# Patient Record
Sex: Male | Born: 1995 | Race: White | Hispanic: No | Marital: Single | Smoking: Never smoker
Health system: Southern US, Community
[De-identification: ages and names within clinical notes are randomized; demographics above are authoritative.]

## PROBLEM LIST (undated history)

## (undated) DIAGNOSIS — F840 Autistic disorder: Secondary | ICD-10-CM

## (undated) DIAGNOSIS — R569 Unspecified convulsions: Secondary | ICD-10-CM

## (undated) DIAGNOSIS — E041 Nontoxic single thyroid nodule: Secondary | ICD-10-CM

## (undated) HISTORY — PX: WISDOM TOOTH EXTRACTION: SHX21

---
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
## 2016-02-22 IMAGING — CT CT HEAD W/O CM
2 series · 16 of 30 positions shown, 18 images · non-contrast
Comparison: None.

CLINICAL DATA: Fall at home, initial encounter. Seizure while in
the shower.

EXAM:
CT HEAD WITHOUT CONTRAST
TECHNIQUE: Contiguous axial images were obtained from the base of the skull
through the vertex without intravenous contrast.

[Series 201: head w/o, idose (1) · axial · non-contrast · 0.49mm/px · z∈[+91,+206]mm · 8 of 31 slices shown, 10 images]
[im 4/31  brain]
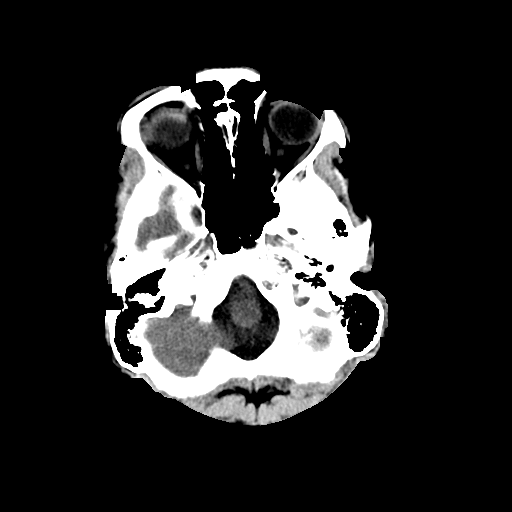
[im 4/31  bone]
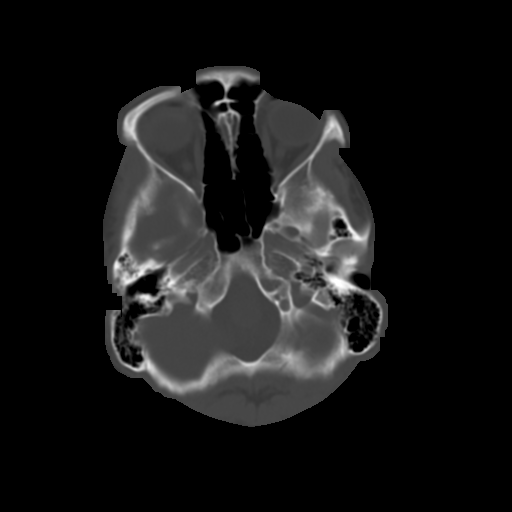
[im 7/31  brain]
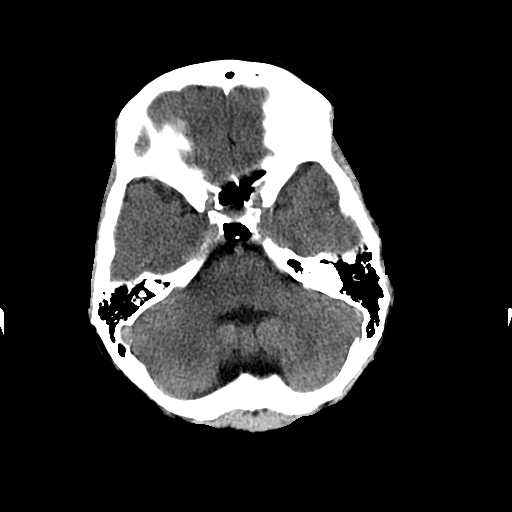
[im 11/31  brain]
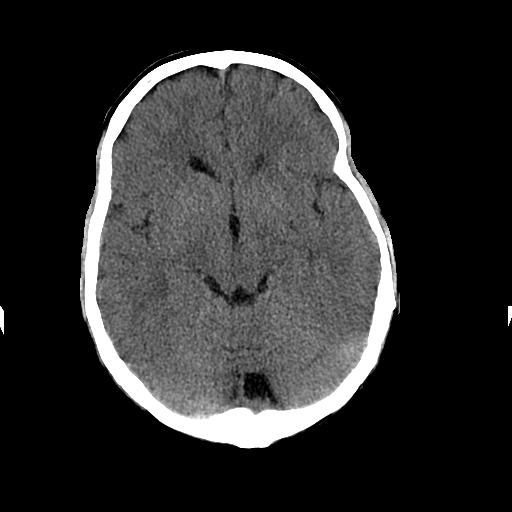
[im 14/31  brain]
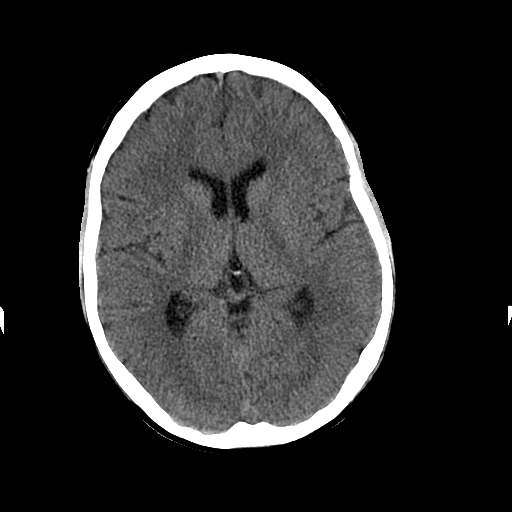
[im 17/31  brain]
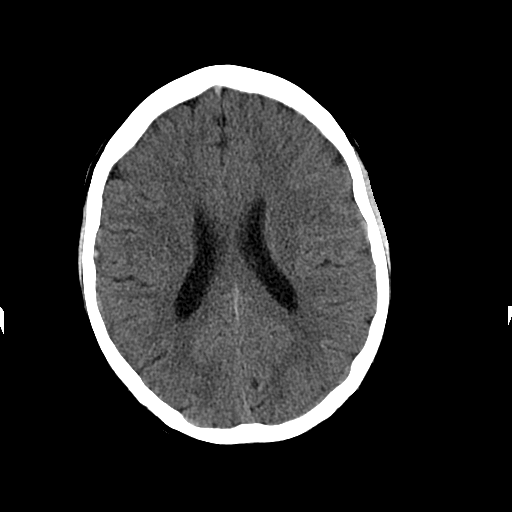
[im 17/31  bone]
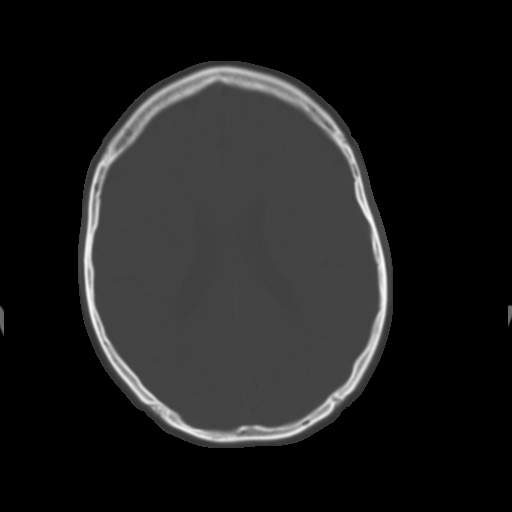
[im 21/31  brain]
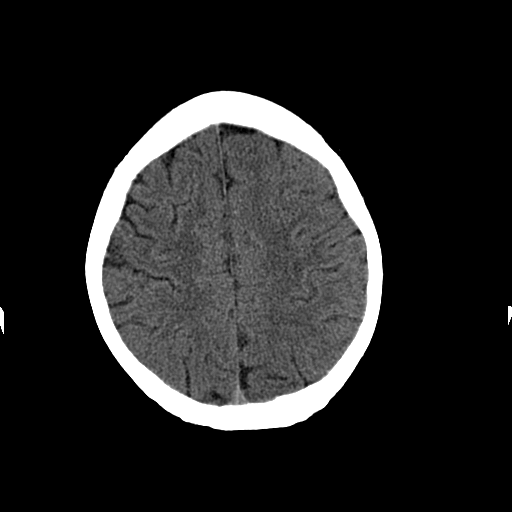
[im 24/31  brain]
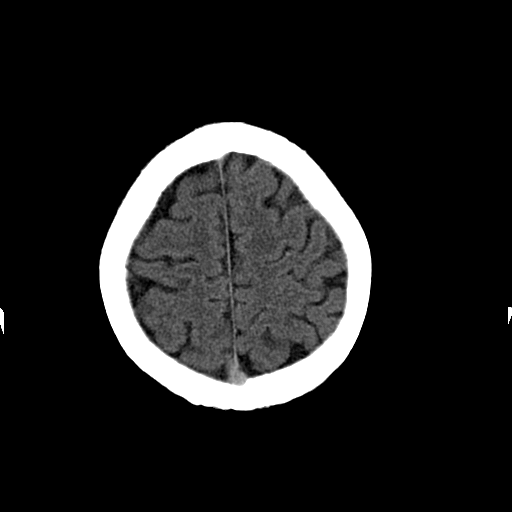
[im 27/31  brain]
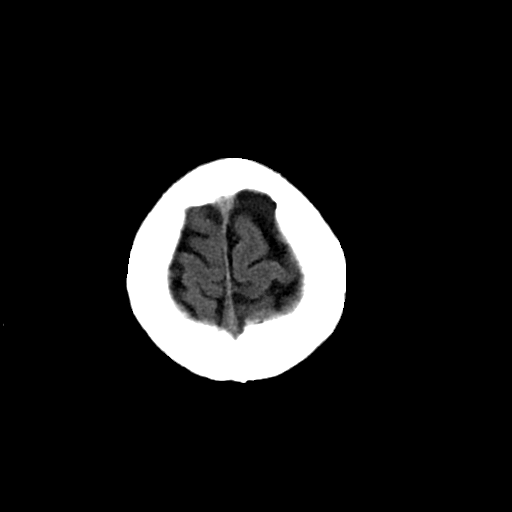

[Series 202: head w/o bone, idose (1) · axial · non-contrast · 0.49mm/px · z∈[+90,+210]mm · 8 of 62 slices shown]
[im 7/62  bone]
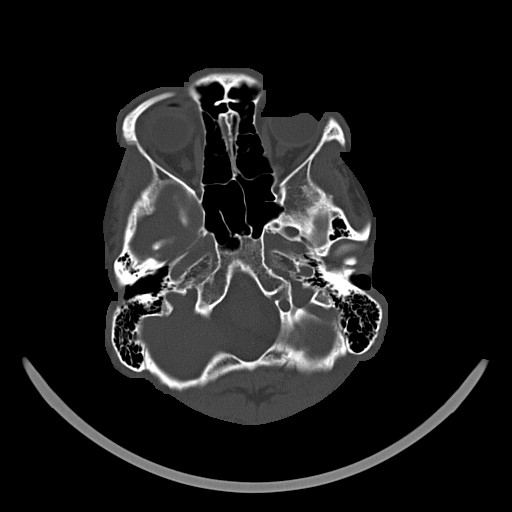
[im 13/62  bone]
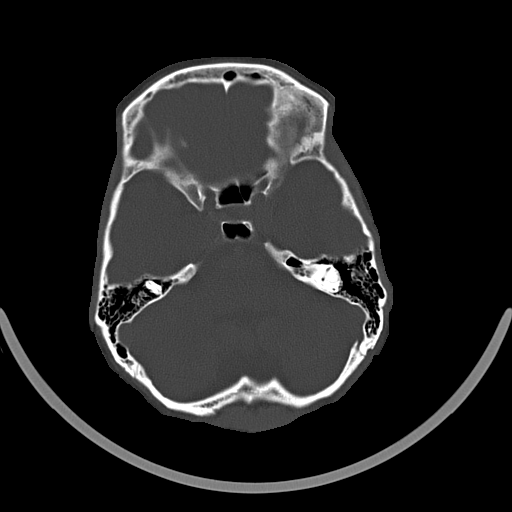
[im 20/62  bone]
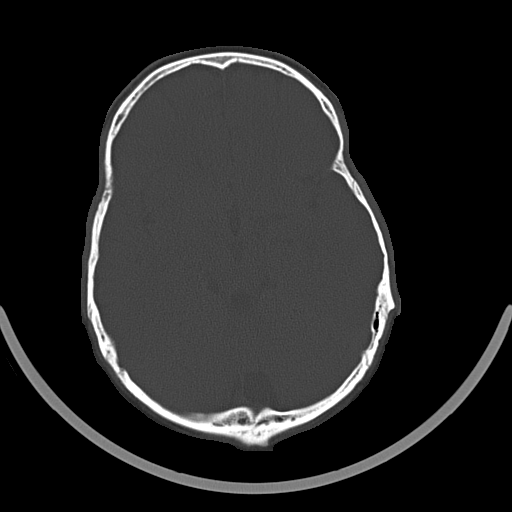
[im 26/62  bone]
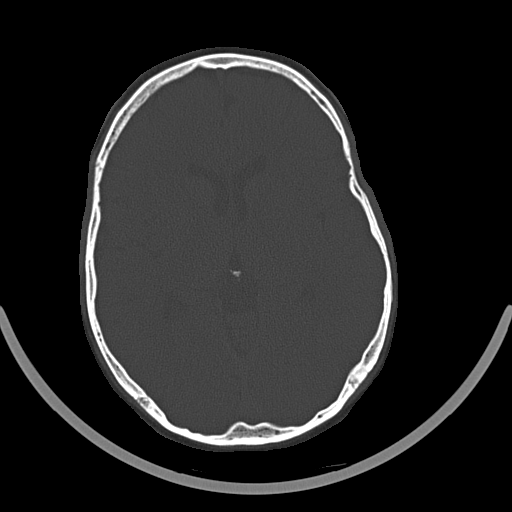
[im 36/62  bone]
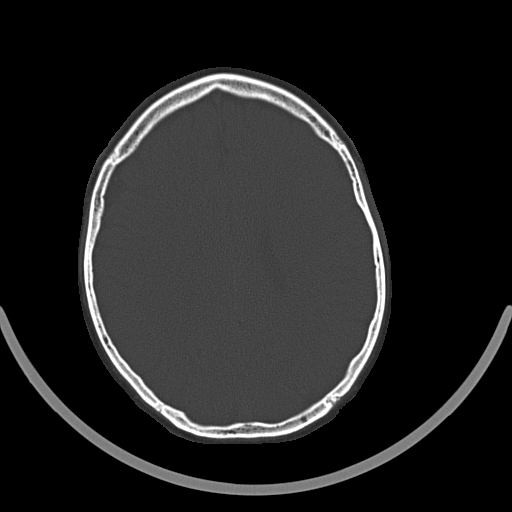
[im 42/62  bone]
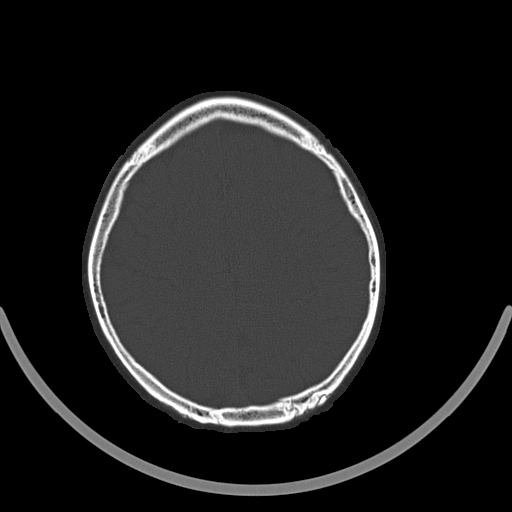
[im 49/62  bone]
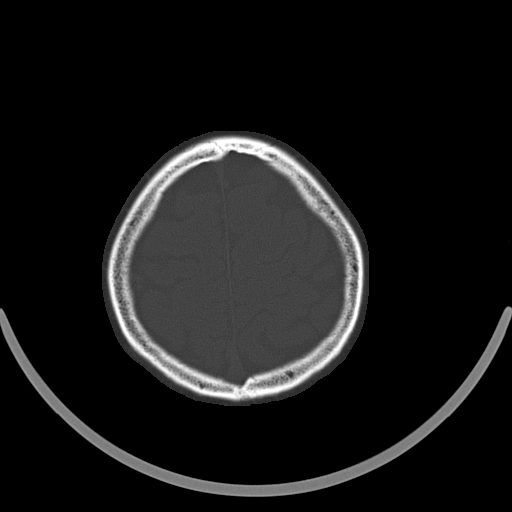
[im 55/62  bone]
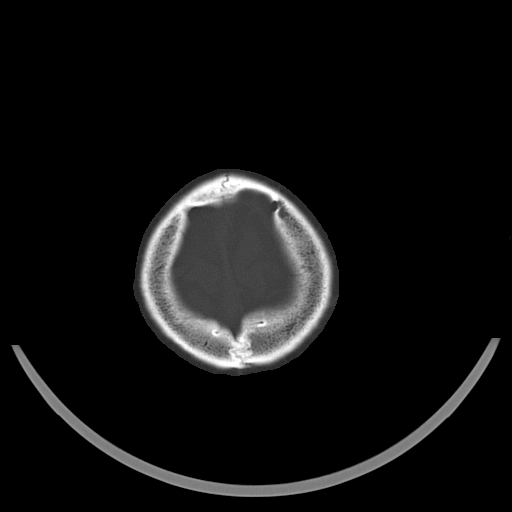

[16 of 30 positions shown; findings below may reference images not displayed]

FINDINGS: No intracranial hemorrhage, mass effect, or midline shift. No
hydrocephalus. The basilar cisterns are patent. Incidental note of
prominent mega cisterna magna. No evidence of territorial infarct.
No intracranial fluid collection. Calvarium is intact. Included
paranasal sinuses and mastoid air cells are well aerated.
IMPRESSION: No acute intracranial abnormality.

## 2016-06-04 MED FILL — MIDAZOLAM HCL 5 MG/ML VIAL: 5 | 2 days supply | Qty: 2 | Fill #0

## 2016-12-06 MED FILL — MIDAZOLAM HCL 5 MG/ML VIAL: 5 | 15 days supply | Qty: 10 | Fill #0

## 2017-03-12 MED FILL — MIDAZOLAM HCL 5 MG/ML VIAL: 5 | 15 days supply | Qty: 10 | Fill #1

## 2017-09-25 MED FILL — MIDAZOLAM HCL 5 MG/ML VIAL: 5 | 5 days supply | Qty: 10 | Fill #0

## 2018-01-17 ENCOUNTER — Telehealth: Payer: Self-pay | Admitting: Family Medicine

## 2018-01-17 NOTE — Telephone Encounter (Signed)
error 

## 2018-05-10 ENCOUNTER — Encounter (HOSPITAL_COMMUNITY): Payer: Self-pay | Admitting: Emergency Medicine

## 2018-05-10 ENCOUNTER — Other Ambulatory Visit: Payer: Self-pay

## 2018-05-10 ENCOUNTER — Emergency Department (HOSPITAL_COMMUNITY)
Admission: EM | Admit: 2018-05-10 | Discharge: 2018-05-10 | Disposition: A | Discharge status: Home / Self Care | Payer: Medicaid Other | Attending: Emergency Medicine | Admitting: Emergency Medicine

## 2018-05-10 ENCOUNTER — Emergency Department (HOSPITAL_COMMUNITY): Payer: Medicaid Other

## 2018-05-10 DIAGNOSIS — S92352A Displaced fracture of fifth metatarsal bone, left foot, initial encounter for closed fracture: Secondary | ICD-10-CM | POA: Insufficient documentation

## 2018-05-10 DIAGNOSIS — S99922A Unspecified injury of left foot, initial encounter: Secondary | ICD-10-CM | POA: Diagnosis present

## 2018-05-10 DIAGNOSIS — Z79899 Other long term (current) drug therapy: Secondary | ICD-10-CM | POA: Diagnosis not present

## 2018-05-10 DIAGNOSIS — X58XXXA Exposure to other specified factors, initial encounter: Secondary | ICD-10-CM | POA: Insufficient documentation

## 2018-05-10 DIAGNOSIS — Y999 Unspecified external cause status: Secondary | ICD-10-CM | POA: Diagnosis not present

## 2018-05-10 DIAGNOSIS — Y929 Unspecified place or not applicable: Secondary | ICD-10-CM | POA: Diagnosis not present

## 2018-05-10 DIAGNOSIS — Y939 Activity, unspecified: Secondary | ICD-10-CM | POA: Insufficient documentation

## 2018-05-10 MED ORDER — IBUPROFEN 400 MG PO TABS
600.0000 mg | ORAL_TABLET | Freq: Once | ORAL | Status: AC
  Administered 2018-05-10: 600 mg via ORAL
  Filled 2018-05-10: qty 2

## 2018-05-10 MED ORDER — IBUPROFEN 600 MG PO TABS: 600.0000 mg | ORAL_TABLET | Freq: Four times a day (QID) | ORAL | 0 refills | Status: AC | PRN

## 2018-05-10 NOTE — ED Triage Notes (Signed)
Patient c/o pain to left foot with swelling and bruising. Patient is autistic and unable to tell mother what happened. Mother reports patient did have low-grade fever of 99.6 this morning. Patient given tylenol for fever and pain at 1pm today. Patient indicates pain with weight bearing.

## 2018-05-10 NOTE — ED Provider Notes (Signed)
[facility] EMERGENCY DEPARTMENT Provider Note   CSN: [id number] Arrival date & time: 05/10/18  1847    History   Chief Complaint Chief Complaint  Patient presents with  . Foot Pain    HPI Steven Ritter is a 23 y.o. male.     Pt presents today with bruising to left foot.  Pt has a hx of autism and is nonverbal.  Mom is unsure what happened.  Mom did give him tylenol around 1300 today.  She has also been putting ice on the foot.  Pt seems to hurt with weight bearing.       Past Medical History:  Diagnosis Date  . Autism   . Seizures (HCC)     There are no active problems to display for this patient.   Past Surgical History:  Procedure Laterality Date  . WISDOM TOOTH EXTRACTION          Home Medications    Prior to Admission medications   Medication Sig Start Date End Date Taking? Authorizing Provider  cetirizine (ZYRTEC) 10 MG tablet Take 10 mg by mouth daily.    [provider]  clonazepam (KLONOPIN) 0.125 MG disintegrating tablet Take 1 tablet (0.125 mg total) by mouth 3 (three) times daily. Patient not taking: Reported on 09/03/2014 03/25/13   Joelyn Oms, MD  clonazePAM (KLONOPIN) 0.25 MG disintegrating tablet Take 0.5 mg by mouth 3 (three) times daily.    [provider]  desmopressin (DDAVP) 0.1 MG tablet Take 0.05 mg by mouth daily.    [provider]  diazepam (DIASTAT ACUDIAL) 10 MG GEL Place 12.5 mg rectally as needed for seizure.    [provider]  fluticasone (FLONASE) 50 MCG/ACT nasal spray Place 2 sprays into both nostrils daily.    [provider]  ibuprofen (ADVIL,MOTRIN) 600 MG tablet Take 1 tablet (600 mg total) by mouth every 6 (six) hours as needed. 05/10/18   Jacalyn Lefevre, MD  lacosamide (VIMPAT) 50 MG TABS tablet 1 by mouth twice a day 7 days, then increase to 2 tablets by mouth twice a day 09/03/14   Rolland Porter, MD  midazolam (VERSED) 5 MG/ML injection Place 2 mLs into the nose as needed  (for seizures). Draw up prescribed dose (ml) in syringe, remove blue vial access device, then attach syringe to nasal atomizer for intranasal administration.    [provider]  montelukast (SINGULAIR) 5 MG chewable tablet Chew 10 mg by mouth at bedtime.    [provider]  OXcarbazepine ER (OXTELLAR XR) 600 MG TB24 Take 1,200 mg by mouth 2 (two) times daily.    [provider]  pyridOXINE (VITAMIN B-6) 100 MG tablet Take 100 mg by mouth 2 (two) times daily.    [provider]    Family History History reviewed. No pertinent family history.  Social History Social History   Tobacco Use  . Smoking status: Never Smoker  . Smokeless tobacco: Never Used  Substance Use Topics  . Alcohol use: No  . Drug use: No     Allergies   Codeine; Xyzal [cetirizine hcl]; Lamotrigine; and Levetiracetam   Review of Systems Review of Systems  Unable to perform ROS: Patient nonverbal  Musculoskeletal:       Left foot bruising     Physical Exam Updated Vital Signs BP 114/66 (BP Location: Right Arm)   Pulse 71   Temp 98.4 F (36.9 C) (Axillary)   Resp 16   Ht 5\' 6"  (1.676 m)  Wt 74.8 kg   SpO2 97%   BMI 26.63 kg/m   Physical Exam Vitals signs and nursing note reviewed.  Constitutional:      Appearance: Normal appearance.  HENT:     Head: Normocephalic and atraumatic.     Right Ear: External ear normal.     Left Ear: External ear normal.     Nose: Nose normal.     Mouth/Throat:     Mouth: Mucous membranes are moist.     Pharynx: Oropharynx is clear.  Eyes:     Extraocular Movements: Extraocular movements intact.     Pupils: Pupils are equal, round, and reactive to light.  Neck:     Musculoskeletal: Normal range of motion and neck supple.  Cardiovascular:     Rate and Rhythm: Normal rate and regular rhythm.     Pulses: Normal pulses.     Heart sounds: Normal heart sounds.  Pulmonary:     Effort: Pulmonary effort is normal.     Breath  sounds: Normal breath sounds.  Abdominal:     General: Abdomen is flat. Bowel sounds are normal.     Palpations: Abdomen is soft.  Musculoskeletal:     Comments: Bruising and tenderness to left foot  Skin:    General: Skin is warm.     Capillary Refill: Capillary refill takes less than 2 seconds.  Neurological:     General: No focal deficit present.     Mental Status: He is alert and oriented to person, place, and time.  Psychiatric:        Mood and Affect: Mood normal.        Behavior: Behavior normal.      ED Treatments / Results  Labs (all labs ordered are listed, but only abnormal results are displayed) Labs Reviewed - No data to display  EKG None  Radiology Dg Foot Complete Left  Result Date: 05/10/2018 CLINICAL DATA:  Pain and swelling of the lateral foot. EXAM: LEFT FOOT - COMPLETE 3+ VIEW COMPARISON:  None. FINDINGS: Comminuted fracture of the mid shaft of the fifth metatarsal. Main distal fragment displaced upward and medial 2-3 mm. No other regional finding. IMPRESSION: Comminuted fracture of the mid shaft of the fifth metatarsal with minimal displacement of the main distal fracture fragment, upward and medial. Electronically Signed   By: Paulina Fusi M.D.   On: 05/10/2018 19:40    Procedures Procedures (including critical care time)  Medications Ordered in ED Medications  ibuprofen (ADVIL,MOTRIN) tablet 600 mg (600 mg Oral Given 05/10/18 1936)     Initial Impression / Assessment and Plan / ED Course  I have reviewed the triage vital signs and the nursing notes.  Pertinent labs & imaging results that were available during my care of the patient were reviewed by me and considered in my medical decision making (see chart for details).    Pt does have a fracture to his left 5th metatarsal.  He is placed in a cam walker and instructed to f/u with ortho.  Return if worse.  Final Clinical Impressions(s) / ED Diagnoses   Final diagnoses:  Closed displaced fracture  of fifth metatarsal bone of left foot, initial encounter    ED Discharge Orders         Ordered    ibuprofen (ADVIL,MOTRIN) 600 MG tablet  Every 6 hours PRN     05/10/18 1950           Jacalyn Lefevre, MD 05/10/18 1951

## 2018-05-12 ENCOUNTER — Telehealth: Payer: Self-pay | Admitting: Orthopedic Surgery

## 2018-05-12 ENCOUNTER — Telehealth (INDEPENDENT_AMBULATORY_CARE_PROVIDER_SITE_OTHER): Payer: Self-pay | Admitting: Radiology

## 2018-05-12 NOTE — Telephone Encounter (Signed)
Patient's mother Olegario Messier called to schedule an appointment for Steven Ritter as he is unable to do this for himself.   Please review notes and advise regarding an appointment for this patient who went to ED on Saturday, 05/10/18 for  Comminuted fracture of the mid shaft of the fifth metatarsal with minimal displacement of the main distal fracture fragment, upward and medial.  Thanks

## 2018-05-12 NOTE — Telephone Encounter (Signed)
Ok 1 week   I am going to keep him in the boot

## 2018-05-12 NOTE — Telephone Encounter (Signed)
I spoke back to Aniello's mother Olegario Messier and gave her the appointment date of May 4th at 11:00 per Dr Mort Sawyers instruction

## 2018-05-12 NOTE — Telephone Encounter (Signed)
This is a 4 week follow up for xrays in person   Wear the boot

## 2018-05-12 NOTE — Telephone Encounter (Signed)
[facility] called and asked about this patient.  Mom called them once told we would see him in 4 weeks.  She is trying to get him seen sooner.  He is autistic per mom and he walks on his toes normally.  She is concerned for him, not sure the boot will immobilize fracture well enough for him.  She says he is walking differently even more so since the injury/getting the boot.  Mom really wants patient to be seen and asks if we can see him now for eval?  Please advise.

## 2018-05-13 DIAGNOSIS — S92352A Displaced fracture of fifth metatarsal bone, left foot, initial encounter for closed fracture: Secondary | ICD-10-CM | POA: Insufficient documentation

## 2018-05-19 ENCOUNTER — Ambulatory Visit (INDEPENDENT_AMBULATORY_CARE_PROVIDER_SITE_OTHER): Payer: Medicaid Other

## 2018-05-19 ENCOUNTER — Ambulatory Visit: Payer: Medicaid Other | Admitting: Orthopedic Surgery

## 2018-05-19 ENCOUNTER — Encounter: Payer: Self-pay | Admitting: Orthopedic Surgery

## 2018-05-19 ENCOUNTER — Other Ambulatory Visit: Payer: Self-pay

## 2018-05-19 DIAGNOSIS — S92352A Displaced fracture of fifth metatarsal bone, left foot, initial encounter for closed fracture: Secondary | ICD-10-CM

## 2018-05-19 NOTE — Progress Notes (Signed)
NEW PATIENT OFFICE VISIT  Chief Complaint  Patient presents with  . Fracture    Left foot  DOI 05/10/18 unsure of how    23 year old male with autism fractured his left hip with no known mechanism of injury  X-ray was done on May 10, 2018 shows a comminuted fracture midshaft fifth metatarsal with soft tissue swelling.  The patient is here with his primary caregiver who gave a history of unknown trauma eventual pain swelling difficulty weightbearing visit to the ER.  He was placed in a cam walker.   Review of Systems  Constitutional: Negative.   Skin: Negative.   Neurological: Positive for tremors and seizures.     Past Medical History:  Diagnosis Date  . Autism   . Seizures (HCC)     Past Surgical History:  Procedure Laterality Date  . WISDOM TOOTH EXTRACTION      No family history on file. Social History   Tobacco Use  . Smoking status: Never Smoker  . Smokeless tobacco: Never Used  Substance Use Topics  . Alcohol use: No  . Drug use: No    Allergies  Allergen Reactions  . Codeine Other (See Comments)    irritability  . Xyzal [Cetirizine Hcl] Other (See Comments)    Tremors  . Lamotrigine     tremors  . Levetiracetam     aggression    Current Meds  Medication Sig  . cetirizine (ZYRTEC) 10 MG tablet Take 10 mg by mouth daily.  . clonazepam (KLONOPIN) 0.125 MG disintegrating tablet Take 1 tablet (0.125 mg total) by mouth 3 (three) times daily.  . clonazePAM (KLONOPIN) 0.25 MG disintegrating tablet Take 0.5 mg by mouth 3 (three) times daily.  . fluticasone (FLONASE) 50 MCG/ACT nasal spray Place 2 sprays into both nostrils daily.  Marland Kitchen. ibuprofen (ADVIL,MOTRIN) 600 MG tablet Take 1 tablet (600 mg total) by mouth every 6 (six) hours as needed.  . lacosamide (VIMPAT) 50 MG TABS tablet 1 by mouth twice a day 7 days, then increase to 2 tablets by mouth twice a day  . midazolam (VERSED) 5 MG/ML injection Place 2 mLs into the nose as needed (for seizures). Draw up  prescribed dose (ml) in syringe, remove blue vial access device, then attach syringe to nasal atomizer for intranasal administration.  . montelukast (SINGULAIR) 5 MG chewable tablet Chew 10 mg by mouth at bedtime.  . OXcarbazepine ER (OXTELLAR XR) 600 MG TB24 Take 1,200 mg by mouth 2 (two) times daily.  Marland Kitchen. pyridOXINE (VITAMIN B-6) 100 MG tablet Take 100 mg by mouth 2 (two) times daily.    BP 110/64   Pulse 63   Ht 5\' 6"  (1.676 m)   Wt 170 lb (77.1 kg)   BMI 27.44 kg/m   Physical Exam General appearance well-developed well-nourished grooming hygiene normal Orientation could not assess secondary to autism which is severe Mood flat affect flat Gait ambulatory with a cam walker  Ortho Exam  Left ankle foot ecchymosis observed in the skin of the soft tissues of the great toe through the lesser digits tenderness at the fifth metatarsal soft tissue swelling there ankle range of motion passively normal ankle stable muscle tone normal no atrophy skin as described color temperature pulse normal normal response to sensory stimuli  MEDICAL DECISION SECTION  Xrays were done at [facility] films [facility] my personal interpretation of the x-ray: Nondisplaced comminuted fracture left fifth metatarsal at the midshaft portion  Follow-up x-ray was taken today see the  dictated report no significant change and no significant angulation or displacement in the fracture   Encounter Diagnosis  Name Primary?  . Closed fracture of base of fifth metatarsal bone of left foot 05/10/18     PLAN: (Rx., injectx, surgery, frx, mri/ct) My plan is to treat this nonoperatively and then if we are having difficulties after COVID-19 restrictions have resolved then we can go back and plated if needed  May be difficult but recommend no weight bearing x 8 weeks   Immobilization device: Cam walker  Patient is not able to use crutches or be nonweightbearing  X-ray in 4 weeks  No orders of the defined types  were placed in this encounter.   Fuller Canada, MD  05/19/2018 11:17 AM

## 2018-06-09 ENCOUNTER — Ambulatory Visit: Payer: Self-pay | Admitting: Orthopedic Surgery

## 2018-06-18 ENCOUNTER — Ambulatory Visit (INDEPENDENT_AMBULATORY_CARE_PROVIDER_SITE_OTHER): Payer: Medicaid Other | Admitting: Orthopedic Surgery

## 2018-06-18 ENCOUNTER — Other Ambulatory Visit: Payer: Self-pay

## 2018-06-18 ENCOUNTER — Ambulatory Visit (INDEPENDENT_AMBULATORY_CARE_PROVIDER_SITE_OTHER): Payer: Medicaid Other

## 2018-06-18 ENCOUNTER — Encounter: Payer: Self-pay | Admitting: Orthopedic Surgery

## 2018-06-18 VITALS — Temp 97.0°F

## 2018-06-18 DIAGNOSIS — S92352A Displaced fracture of fifth metatarsal bone, left foot, initial encounter for closed fracture: Secondary | ICD-10-CM

## 2018-06-18 DIAGNOSIS — S92352D Displaced fracture of fifth metatarsal bone, left foot, subsequent encounter for fracture with routine healing: Secondary | ICD-10-CM

## 2018-06-18 NOTE — Progress Notes (Signed)
Patient ID: Steven Ritter, male   DOB: 06/10/1995, 23 y.o.   MRN: [id number]  FRACTURE CARE   Chief Complaint  Patient presents with  . Follow-up    4 week recheck on left foot fracture, DOI 05-10-18.    Encounter Diagnosis  Name Primary?  . Closed fracture of base of fifth metatarsal bone of left foot 05/10/18 Yes    CURRENT TREATMENT : Walker boot  POST INJURY DAY: 39  GLOBAL PERIOD DAY 30/90  NO C/O PAIN   XRAYS TODAY STABLE HEALINGFRACTURE   CLINICALLY foot looks fine  Encounter Diagnosis  Name Primary?  . Closed fracture of base of fifth metatarsal bone of left foot 05/10/18 Yes   X-ray 4 weeks

## 2018-07-16 ENCOUNTER — Ambulatory Visit: Payer: Medicaid Other | Admitting: Orthopedic Surgery

## 2018-07-21 ENCOUNTER — Ambulatory Visit (INDEPENDENT_AMBULATORY_CARE_PROVIDER_SITE_OTHER): Payer: Medicaid Other

## 2018-07-21 ENCOUNTER — Ambulatory Visit (INDEPENDENT_AMBULATORY_CARE_PROVIDER_SITE_OTHER): Payer: Medicaid Other | Admitting: Orthopedic Surgery

## 2018-07-21 ENCOUNTER — Other Ambulatory Visit: Payer: Self-pay

## 2018-07-21 ENCOUNTER — Encounter: Payer: Self-pay | Admitting: Orthopedic Surgery

## 2018-07-21 VITALS — Temp 97.2°F | Ht 66.0 in | Wt 170.0 lb

## 2018-07-21 DIAGNOSIS — S92352A Displaced fracture of fifth metatarsal bone, left foot, initial encounter for closed fracture: Secondary | ICD-10-CM

## 2018-07-21 NOTE — Patient Instructions (Signed)
May return to normal shoes

## 2018-07-21 NOTE — Progress Notes (Signed)
Chief Complaint  Patient presents with  . Fracture    Left foot DOI 05/10/18    Fracture care follow-up 8+ weeks after fifth metatarsal fracture patient doing well walking in a cam walker clinical exam seems normal x-ray looks good patient can resume normal shoe wear follow-up as needed

## 2019-06-16 NOTE — Telephone Encounter (Signed)
Error

## 2019-10-29 IMAGING — CR LEFT FOOT - COMPLETE 3+ VIEW
3 series · 3 of 3 positions shown · non-contrast
Comparison: None.

CLINICAL DATA: Pain and swelling of the lateral foot.

EXAM:
LEFT FOOT - COMPLETE 3+ VIEW

[ap]
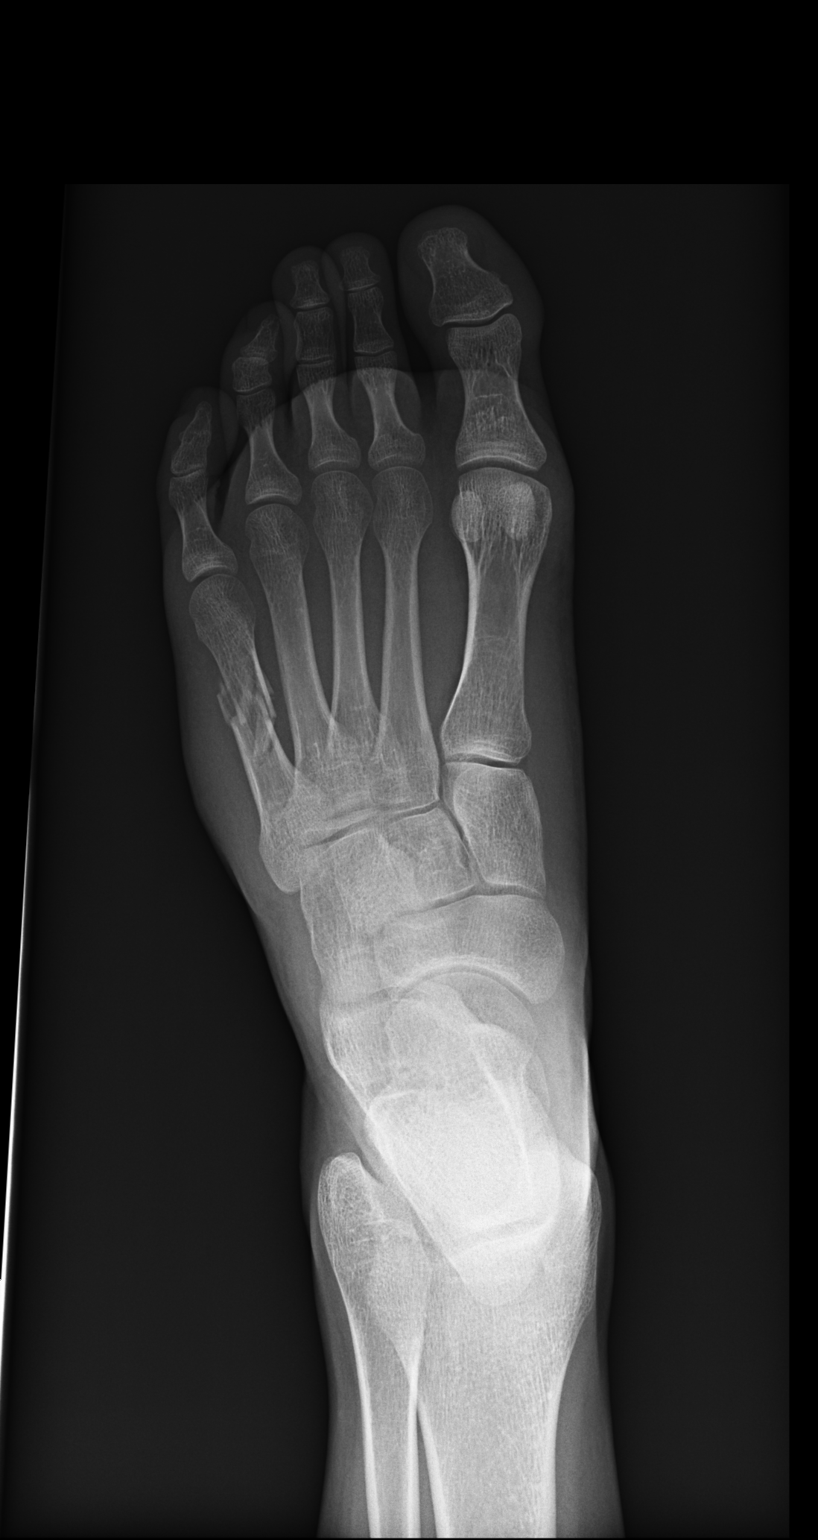

[oblique]
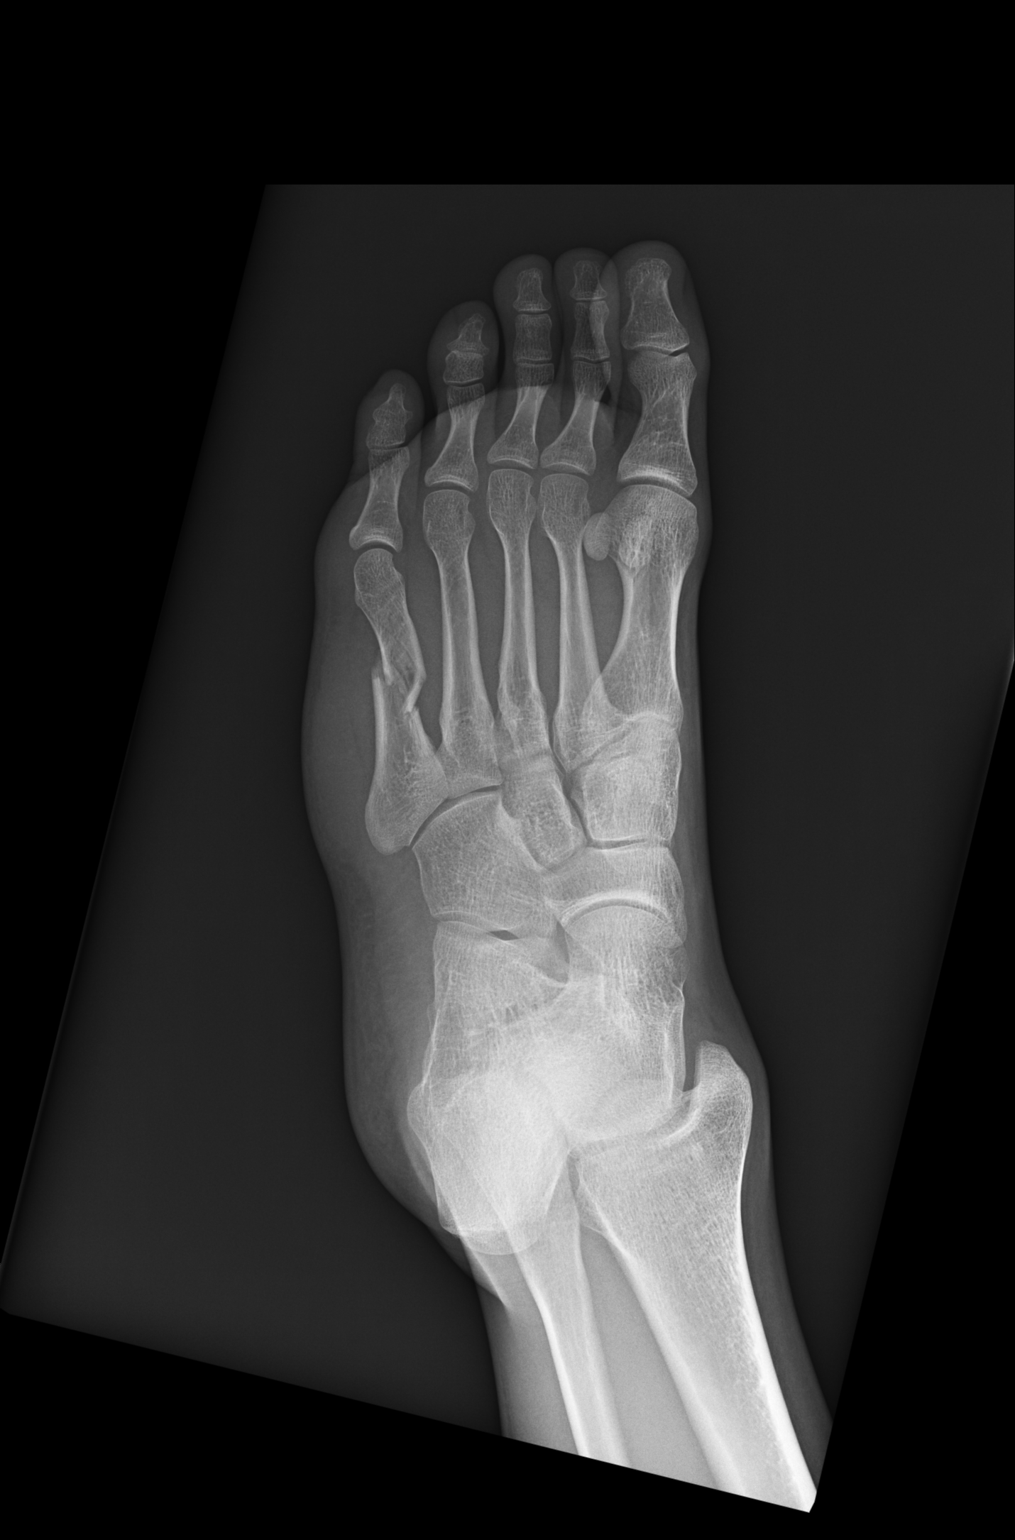

[lat]
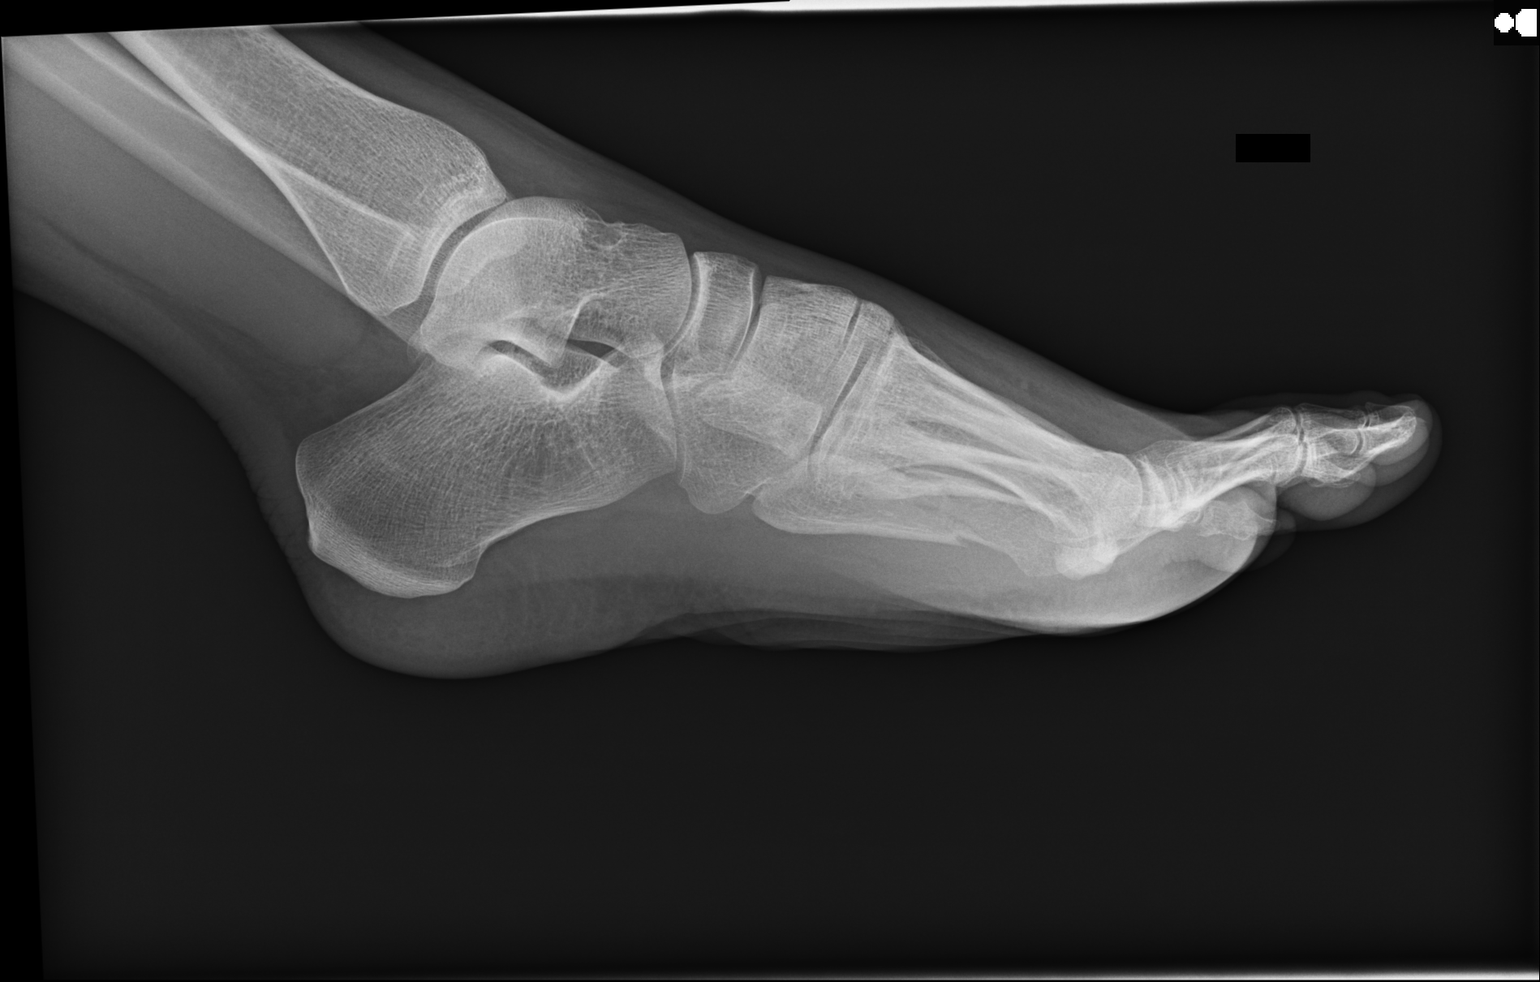

[3 of 3 positions shown; findings below may reference images not displayed]

FINDINGS: Comminuted fracture of the mid shaft of the fifth metatarsal. Main
distal fragment displaced upward and medial 2-3 mm. No other
regional finding.
IMPRESSION: Comminuted fracture of the mid shaft of the fifth metatarsal with
minimal displacement of the main distal fracture fragment, upward
and medial.

## 2022-04-27 ENCOUNTER — Emergency Department (HOSPITAL_COMMUNITY)
Admission: EM | Admit: 2022-04-27 | Discharge: 2022-04-28 | Disposition: A | Discharge status: Home / Self Care | Payer: Medicaid Other | Attending: Emergency Medicine | Admitting: Emergency Medicine

## 2022-04-27 ENCOUNTER — Other Ambulatory Visit: Payer: Self-pay

## 2022-04-27 DIAGNOSIS — N3000 Acute cystitis without hematuria: Secondary | ICD-10-CM | POA: Diagnosis not present

## 2022-04-27 DIAGNOSIS — F840 Autistic disorder: Secondary | ICD-10-CM

## 2022-04-27 DIAGNOSIS — R569 Unspecified convulsions: Secondary | ICD-10-CM | POA: Diagnosis present

## 2022-04-27 LAB — URINALYSIS, ROUTINE W REFLEX MICROSCOPIC
Bilirubin Urine: NEGATIVE
Glucose, UA: NEGATIVE mg/dL
Ketones, ur: NEGATIVE mg/dL
Leukocytes,Ua: NEGATIVE
Nitrite: NEGATIVE
Protein, ur: 100 mg/dL — AB
Specific Gravity, Urine: 1.017 (ref 1.005–1.030)
pH: 5 (ref 5.0–8.0)

## 2022-04-27 MED ORDER — TOPIRAMATE 25 MG PO TABS
50.0000 mg | ORAL_TABLET | ORAL | Status: AC
  Administered 2022-04-27: 50 mg via ORAL
  Filled 2022-04-27: qty 2

## 2022-04-27 MED ORDER — CLONAZEPAM 0.5 MG PO TABS
1.0000 mg | ORAL_TABLET | ORAL | Status: AC
  Administered 2022-04-27: 1 mg via ORAL
  Filled 2022-04-27: qty 2

## 2022-04-27 MED ORDER — LACOSAMIDE 50 MG PO TABS
200.0000 mg | ORAL_TABLET | ORAL | Status: AC
  Administered 2022-04-27: 200 mg via ORAL
  Filled 2022-04-27: qty 4

## 2022-04-27 MED ORDER — OXCARBAZEPINE 300 MG PO TABS
600.0000 mg | ORAL_TABLET | ORAL | Status: AC
  Administered 2022-04-27: 600 mg via ORAL
  Filled 2022-04-27: qty 2

## 2022-04-27 NOTE — ED Provider Notes (Signed)
[place] EMERGENCY DEPARTMENT AT [facility] Provider Note   CSN: [id number] Arrival date & time: 04/27/22  2129     History  Chief Complaint  Patient presents with   Seizures    Steven Ritter is a 27 y.o. male.  27 year old male with a history of autism with difficulty speaking and seizures on multiple antiepileptics who presents emergency department after seizure.  History obtained per the patient's mother since he has difficulty talking at baseline.  Was seizure-free for several months and then had a seizure yesterday and 1 again this morning.  Tonight had a seizure that lasted approximately 17 minutes.  When EMS arrived he was hypoxic and they placed him on nasal cannula and gave him 10 of Versed intranasal.  Mother states that the seizures are always grand mall and was again today.  Denies any recent illnesses.  Says that on Tuesday he was changed to generic Vimpat instead of the brand name and thinks that that might be related to her seizures.  Denies any additional triggers recently.  Current seizure medications includes  Oxtellar XR 1200 mg twice daily Topamax 50 mg twice daily Vimpat 200 mg twice daily Clonazepam 0.75 mg 3 times daily       Home Medications Prior to Admission medications   Medication Sig Start Date End Date Taking? Authorizing Provider  cephALEXin (KEFLEX) 500 MG capsule Take 1 capsule (500 mg total) by mouth 2 (two) times daily for 7 days. 04/28/22 05/05/22 Yes Rondel Baton, MD  topiramate (TOPAMAX) 50 MG tablet Take 1.5 tablets (75 mg total) by mouth 2 (two) times daily. 04/28/22 05/28/22 Yes Rondel Baton, MD  cetirizine (ZYRTEC) 10 MG tablet Take 10 mg by mouth daily.    [provider]  clonazePAM (KLONOPIN) 0.5 MG disintegrating tablet DISSOLVE 1 AND 1/2 TABLETS ON THE TONGUE 3 TIMES A DAY 06/21/17   [provider]  fluticasone (FLONASE) 50 MCG/ACT nasal spray Place 2 sprays into both nostrils daily.     [provider]  ibuprofen (ADVIL,MOTRIN) 600 MG tablet Take 1 tablet (600 mg total) by mouth every 6 (six) hours as needed. 05/10/18   Jacalyn Lefevre, MD  lacosamide (VIMPAT) 50 MG TABS tablet 1 by mouth twice a day 7 days, then increase to 2 tablets by mouth twice a day 09/03/14   Rolland Porter, MD  midazolam (VERSED) 5 MG/ML injection Place 2 mLs into the nose as needed (for seizures). Draw up prescribed dose (ml) in syringe, remove blue vial access device, then attach syringe to nasal atomizer for intranasal administration.    [provider]  montelukast (SINGULAIR) 5 MG chewable tablet Chew 10 mg by mouth at bedtime.    [provider]  OXcarbazepine ER (OXTELLAR XR) 600 MG TB24 Take 1,200 mg by mouth 2 (two) times daily.    [provider]  pyridOXINE (VITAMIN B-6) 100 MG tablet Take 100 mg by mouth 2 (two) times daily.    [provider]      Allergies    Codeine, Xyzal [cetirizine hcl], Lamotrigine, and Levetiracetam    Review of Systems   Review of Systems  Physical Exam Updated Vital Signs BP 114/72   Pulse 72   Temp 97.9 F (36.6 C) (Oral)   Resp 20   Ht 5\' 6"  (1.676 m)   Wt 83.9 kg   SpO2 96%   BMI 29.86 kg/m  Physical Exam Vitals and nursing note reviewed.  Constitutional:  General: He is not in acute distress.    Appearance: He is well-developed.     Comments: Satting well on room air with normal oxygen saturations  HENT:     Head: Normocephalic and atraumatic.     Right Ear: External ear normal.     Left Ear: External ear normal.     Nose: Nose normal.  Eyes:     Extraocular Movements: Extraocular movements intact.     Conjunctiva/sclera: Conjunctivae normal.     Pupils: Pupils are equal, round, and reactive to light.     Comments: Pupils 6 mm bilaterally  Cardiovascular:     Rate and Rhythm: Normal rate and regular rhythm.     Heart sounds: Normal heart sounds.  Pulmonary:     Effort: Pulmonary effort is  normal. No respiratory distress.     Breath sounds: Normal breath sounds.  Musculoskeletal:     Cervical back: Normal range of motion and neck supple.     Right lower leg: No edema.     Left lower leg: No edema.  Skin:    General: Skin is warm and dry.  Neurological:     Mental Status: He is alert. Mental status is at baseline.     Comments: Cranial nerves II through XII intact.  Full strength in upper and lower extremities.  Intact sensation to light touch in upper and lower extremities.  At baseline per mother.  Psychiatric:        Mood and Affect: Mood normal.        Behavior: Behavior normal.     ED Results / Procedures / Treatments   Labs (all labs ordered are listed, but only abnormal results are displayed) Labs Reviewed  CBC - Abnormal; Notable for the following components:      Result Value   RBC 4.08 (*)    Hemoglobin 12.3 (*)    HCT 37.2 (*)    All other components within normal limits  BASIC METABOLIC PANEL - Abnormal; Notable for the following components:   Potassium 3.1 (*)    CO2 21 (*)    Glucose, Bld 101 (*)    All other components within normal limits  URINALYSIS, ROUTINE W REFLEX MICROSCOPIC - Abnormal; Notable for the following components:   APPearance HAZY (*)    Hgb urine dipstick SMALL (*)    Protein, ur 100 (*)    Bacteria, UA MANY (*)    All other components within normal limits  URINE CULTURE  MAGNESIUM    EKG EKG Interpretation  Date/Time:  Friday April 27 2022 21:30:12 EDT Ventricular Rate:  91 PR Interval:  157 QRS Duration: 102 QT Interval:  358 QTC Calculation: 441 R Axis:   42 Text Interpretation: Sinus rhythm Confirmed by Vonita Moss [phone]) on 04/27/2022 11:25:57 PM  Radiology No results found.  Procedures Procedures   Medications Ordered in ED Medications  clonazePAM (KLONOPIN) tablet 1 mg (1 mg Oral Given 04/27/22 2356)  lacosamide (VIMPAT) tablet 200 mg (200 mg Oral Given 04/27/22 2356)  topiramate (TOPAMAX) tablet  50 mg (50 mg Oral Given 04/27/22 2357)  Oxcarbazepine (TRILEPTAL) tablet 600 mg (600 mg Oral Given 04/27/22 2357)  cephALEXin (KEFLEX) capsule 500 mg (500 mg Oral Given 04/28/22 0038)  potassium chloride SA (KLOR-CON M) CR tablet 40 mEq (40 mEq Oral Given 04/28/22 0038)    ED Course/ Medical Decision Making/ A&P  Medical Decision Making Amount and/or Complexity of Data Reviewed Labs: ordered.  Risk Prescription drug management.   NAJAH STREATER is a 27 y.o. male with comorbidities that complicate the patient evaluation including autism and seizures who presents emergency department with grand mal seizure lasting 17 minutes  Initial Ddx:  Refractory seizures, status epilepticus, medication noncompliance, UTI  MDM:  Unclear why patient is having refractory seizures.  Per mother no known triggers that the patient has been exposed to and no recent medication changes aside from his Vimpat from the brand name to generic but would not suspect this to be causing his seizures.  Will check urine for UTI.  No respiratory symptoms to suggest pneumonia.  Will discuss medication changes with neurology as well.  Plan:  Labs Urinalysis Neurology consult  ED Summary/Re-evaluation:  Discussed with Dr. Amada Jupiter from neurology who recommends increasing the patient's daily dose of Topamax to 75 mg twice daily and for the next 2 days giving increased dose of his Klonopin to prevent any additional seizures.  Urinalysis did show that he had a urinary tract infection so was treated with Keflex and given a prescription of this at home.  No additional seizures and patient was back to baseline so he was discharged with instructions to follow-up with his primary doctor and neurologist.  This patient presents to the ED for concern of complaints listed in HPI, this involves an extensive number of treatment options, and is a complaint that carries with it a high risk of complications and  morbidity. Disposition including potential need for admission considered.   Dispo: DC Home. Return precautions discussed including, but not limited to, those listed in the AVS. Allowed pt time to ask questions which were answered fully prior to dc.  Additional history obtained from mother Records reviewed Outpatient Clinic Notes The following labs were independently interpreted: Urinalysis and show urinary tract infection I personally reviewed and interpreted cardiac monitoring: normal sinus rhythm  I personally reviewed and interpreted the pt's EKG: see above for interpretation  I have reviewed the patients home medications and made adjustments as needed Consults: Neurology Social Determinants of health:  Autism  Final Clinical Impression(s) / ED Diagnoses Final diagnoses:  Seizure (HCC)  Acute cystitis without hematuria  Autism spectrum disorder    Rx / DC Orders ED Discharge Orders          Ordered    cephALEXin (KEFLEX) 500 MG capsule  2 times daily        04/28/22 0022    topiramate (TOPAMAX) 50 MG tablet  2 times daily        04/28/22 0022              Rondel Baton, MD 04/28/22 1116

## 2022-04-27 NOTE — ED Triage Notes (Signed)
Pt bib [facility] from home after witnessed 17 min grand mal seizure while eating dinner. Hx seizures, last one yesterday. Given 10 mg versed tonight intranasally pta. O2 77% RA upon fire arrival, placed on NRB, 92% upon EMS arrival, placed on 4LNC 95%. Currently 95% RA. Autistic, nonverbal at baseline, initially postictal but now back to baseline upon arrival

## 2022-04-28 LAB — BASIC METABOLIC PANEL
Anion gap: 12 (ref 5–15)
BUN: 9 mg/dL (ref 6–20)
CO2: 21 mmol/L — ABNORMAL LOW (ref 22–32)
Calcium: 9.3 mg/dL (ref 8.9–10.3)
Chloride: 106 mmol/L (ref 98–111)
Creatinine, Ser: 0.87 mg/dL (ref 0.61–1.24)
GFR, Estimated: >60 mL/min (ref >60)
Glucose, Bld: 101 mg/dL — ABNORMAL HIGH (ref 70–99)
Potassium: 3.1 mmol/L — ABNORMAL LOW (ref 3.5–5.1)
Sodium: 139 mmol/L (ref 135–145)

## 2022-04-28 LAB — CBC
HCT: 37.2 % — ABNORMAL LOW (ref 39.0–52.0)
Hemoglobin: 12.3 g/dL — ABNORMAL LOW (ref 13.0–17.0)
MCH: 30.1 pg (ref 26.0–34.0)
MCHC: 33.1 g/dL (ref 30.0–36.0)
MCV: 91.2 fL (ref 80.0–100.0)
Platelets: 353 10*3/uL (ref 150–400)
RBC: 4.08 MIL/uL — ABNORMAL LOW (ref 4.22–5.81)
RDW: 12.2 % (ref 11.5–15.5)
WBC: 9 10*3/uL (ref 4.0–10.5)
nRBC: 0 % (ref 0.0–0.2)

## 2022-04-28 LAB — MAGNESIUM: Magnesium: 2.3 mg/dL (ref 1.7–2.4)

## 2022-04-28 MED ORDER — CEPHALEXIN 500 MG PO CAPS: 500.0000 mg | ORAL_CAPSULE | Freq: Two times a day (BID) | ORAL | 0 refills | Status: AC

## 2022-04-28 MED ORDER — TOPIRAMATE 50 MG PO TABS: 75.0000 mg | ORAL_TABLET | Freq: Two times a day (BID) | ORAL | 0 refills | Status: AC

## 2022-04-28 MED ORDER — POTASSIUM CHLORIDE CRYS ER 20 MEQ PO TBCR
40.0000 meq | EXTENDED_RELEASE_TABLET | Freq: Once | ORAL | Status: AC
  Administered 2022-04-28: 40 meq via ORAL
  Filled 2022-04-28: qty 2

## 2022-04-28 MED ORDER — CEPHALEXIN 250 MG PO CAPS
500.0000 mg | ORAL_CAPSULE | ORAL | Status: AC
  Administered 2022-04-28: 500 mg via ORAL
  Filled 2022-04-28: qty 2

## 2022-04-28 NOTE — Discharge Instructions (Addendum)
You were seen for seizures in the emergency department.   At home, please take an additional dose of Klonopin daily for the next 2 days (4 times a day rather than 3 times a day).  Increase the Topamax to 75 mg twice daily.   Take the Keflex for your urinary tract infection.  Follow-up with your neurologist in the next 2 to 3 days.  Please talk to them about changing the prescription from the [place].  Follow-up with your primary doctor in 2-3 days regarding your visit.    Return immediately to the emergency department if you experience any of the following: Fevers, flank pain, seizures lasting more than 5 minutes, or any other concerning symptoms.    Thank you for visiting our Emergency Department. It was a pleasure taking care of you today.

## 2022-04-28 NOTE — ED Notes (Addendum)
D/c instructions gone over with patient's parents. Opportunities for questioning presented. None at this time. Patient discharged without issue.

## 2022-04-29 LAB — URINE CULTURE: Culture: NO GROWTH

## 2023-02-15 ENCOUNTER — Emergency Department (HOSPITAL_BASED_OUTPATIENT_CLINIC_OR_DEPARTMENT_OTHER): Payer: MEDICAID

## 2023-02-15 ENCOUNTER — Emergency Department (HOSPITAL_BASED_OUTPATIENT_CLINIC_OR_DEPARTMENT_OTHER)
Admission: EM | Admit: 2023-02-15 | Discharge: 2023-02-15 | Disposition: A | Discharge status: Home / Self Care | Payer: MEDICAID | Attending: Emergency Medicine | Admitting: Emergency Medicine

## 2023-02-15 ENCOUNTER — Other Ambulatory Visit: Payer: Self-pay

## 2023-02-15 ENCOUNTER — Encounter (HOSPITAL_BASED_OUTPATIENT_CLINIC_OR_DEPARTMENT_OTHER): Payer: Self-pay | Admitting: Emergency Medicine

## 2023-02-15 DIAGNOSIS — S52501A Unspecified fracture of the lower end of right radius, initial encounter for closed fracture: Secondary | ICD-10-CM | POA: Insufficient documentation

## 2023-02-15 DIAGNOSIS — F840 Autistic disorder: Secondary | ICD-10-CM | POA: Insufficient documentation

## 2023-02-15 DIAGNOSIS — S52601A Unspecified fracture of lower end of right ulna, initial encounter for closed fracture: Secondary | ICD-10-CM | POA: Insufficient documentation

## 2023-02-15 DIAGNOSIS — S8991XA Unspecified injury of right lower leg, initial encounter: Secondary | ICD-10-CM | POA: Diagnosis present

## 2023-02-15 DIAGNOSIS — W010XXA Fall on same level from slipping, tripping and stumbling without subsequent striking against object, initial encounter: Secondary | ICD-10-CM | POA: Insufficient documentation

## 2023-02-15 MED ORDER — LIDOCAINE HCL (PF) 1 % IJ SOLN
10.0000 mL | Freq: Once | INTRAMUSCULAR | Status: AC
  Administered 2023-02-15: 10 mL
  Filled 2023-02-15: qty 10

## 2023-02-15 MED ORDER — OXYCODONE HCL 5 MG PO TABS: 5.0000 mg | ORAL_TABLET | Freq: Four times a day (QID) | ORAL | 0 refills | Status: AC | PRN

## 2023-02-15 MED ORDER — OXYCODONE-ACETAMINOPHEN 5-325 MG PO TABS
1.0000 | ORAL_TABLET | Freq: Once | ORAL | Status: AC
  Administered 2023-02-15: 1 via ORAL
  Filled 2023-02-15: qty 1

## 2023-02-15 NOTE — ED Triage Notes (Addendum)
 FOOSH- deformity to right wrist. NAD. Autistic-minimal communication. Accompanied by father.

## 2023-02-15 NOTE — Discharge Instructions (Addendum)
 Your x-rays today shows that you have a fracture of the 2 bones that make up your forearm, the radius and ulna.  The bone was out of place, so we reduced your fracture today.  You have been placed into a splint to hold the fracture in place.  Please keep this splint clean and dry.  Wrap the splint with a garbage bag or Saran wrap to keep it dry while showering or bathing.  Do not take off the splint.   You may take up to 1000mg  of tylenol  every 6 hours as needed for pain. Do not take more then 4g per day.   You may use up to 600mg  ibuprofen  every 6 hours as needed for pain.  Do not exceed 2.4g of ibuprofen  per day.  You have been prescribed Oxycodone -this is a narcotic/controlled substance medication that has potential addicting qualities.  You may take 1 tablet every 6 hours as needed for severe pain not controlled with tylenol  and ibuprofen .  Do not drive or operate heavy machinery when taking this medicine as it can be sedating. Do not drink alcohol or take other sedating medications when taking this medicine for safety reasons.  Keep this out of reach of small children.     Call the orthopedic doctor listed below first thing Monday morning to schedule a follow-up appointment.  I spoke to Dr. Arlinda (the orthopedics doctor) today and he will plan on getting you in for follow-up either Monday or Tuesday.  Return to the ER for any uncontrolled pain, numbness or tingling in the fingers, discoloration in the fingers, any other new or concerning symptoms.

## 2023-02-15 NOTE — ED Provider Notes (Signed)
 [place] EMERGENCY DEPARTMENT AT [facility] Provider Note   CSN: [id number] Arrival date & time: 02/15/23  1802     History  Chief Complaint  Patient presents with   Arm Pain    Steven Ritter is a 28 y.o. male with autism and nonverbal, brought in by father at bedside who notes concern for a deformity to the patient's left wrist.  States patient was walking inside from being out in the snow, and slipped and fell onto his left wrist.  He noted the wrist looked more deformed and swollen than normal. Patient unable to verbalize if he is having any pain.   HPI     Home Medications Prior to Admission medications   Medication Sig Start Date End Date Taking? Authorizing Provider  oxyCODONE  (ROXICODONE ) 5 MG immediate release tablet Take 1 tablet (5 mg total) by mouth every 6 (six) hours as needed for up to 5 days for severe pain (pain score 7-10) or breakthrough pain (Pain not controlled with tylenol  and ibuprofen ). 02/15/23 02/20/23 Yes Veta Palma, PA-C  cetirizine (ZYRTEC) 10 MG tablet Take 10 mg by mouth daily.    [provider]  clonazePAM  (KLONOPIN ) 0.5 MG disintegrating tablet DISSOLVE 1 AND 1/2 TABLETS ON THE TONGUE 3 TIMES A DAY 06/21/17   [provider]  fluticasone (FLONASE) 50 MCG/ACT nasal spray Place 2 sprays into both nostrils daily.    [provider]  ibuprofen  (ADVIL ,MOTRIN ) 600 MG tablet Take 1 tablet (600 mg total) by mouth every 6 (six) hours as needed. 05/10/18   Dean Clarity, MD  lacosamide  (VIMPAT ) 50 MG TABS tablet 1 by mouth twice a day 7 days, then increase to 2 tablets by mouth twice a day 09/03/14   Lynwood Anes, MD  midazolam (VERSED) 5 MG/ML injection Place 2 mLs into the nose as needed (for seizures). Draw up prescribed dose (ml) in syringe, remove blue vial access device, then attach syringe to nasal atomizer for intranasal administration.    [provider]  montelukast (SINGULAIR) 5 MG chewable tablet  Chew 10 mg by mouth at bedtime.    [provider]  OXcarbazepine  ER (OXTELLAR XR ) 600 MG TB24 Take 1,200 mg by mouth 2 (two) times daily.    [provider]  pyridOXINE (VITAMIN B-6) 100 MG tablet Take 100 mg by mouth 2 (two) times daily.    [provider]  topiramate  (TOPAMAX ) 50 MG tablet Take 1.5 tablets (75 mg total) by mouth 2 (two) times daily. 04/28/22 05/28/22  Yolande Lamar BROCKS, MD      Allergies    Codeine, Xyzal [cetirizine hcl], Lamotrigine, and Levetiracetam    Review of Systems   Review of Systems  Musculoskeletal:        Left wrist deformity    Physical Exam Updated Vital Signs BP 122/81   Pulse 83   Temp 98 F (36.7 C) (Oral)   Resp 14   Ht 5' 6 (1.676 m)   Wt 83.9 kg   SpO2 96%   BMI 29.86 kg/m  Physical Exam Vitals and nursing note reviewed.  Constitutional:      Appearance: Normal appearance.  HENT:     Head: Atraumatic.  Cardiovascular:     Comments: Cap refill under 2 seconds in the left 1st through 5th digits Pulmonary:     Effort: Pulmonary effort is normal.  Musculoskeletal:     Comments: Patient with deformity to the left wrist, no significant edema or ecchymoses.  Does not  perform range of motion of the left wrist, but will move left upper extremity without difficulty and holds left wrist up without any wrist drop.  Able to flex and extend at the left 1st through 5th MCPs, PIP, DIPs.  Does not appear to have any tenderness palpation of the left humerus, elbow diffusely, proximal radius or ulna.  Neurological:     General: No focal deficit present.     Mental Status: He is alert.  Psychiatric:        Mood and Affect: Mood normal.        Behavior: Behavior normal.     ED Results / Procedures / Treatments   Labs (all labs ordered are listed, but only abnormal results are displayed) Labs Reviewed - No data to display  EKG None  Radiology DG Elbow 2 Views Right Result Date: 02/15/2023 CLINICAL DATA:  Slip  and fall.  Elbow pain. EXAM: RIGHT ELBOW - 2 VIEW COMPARISON:  None Available. FINDINGS: Single lateral view of the elbow obtained. Overlying splint material in place. No evidence of elbow fracture. The alignment is normal on provided view. No large joint effusion. IMPRESSION: Lateral view of the elbow without acute abnormality. Electronically Signed   By: Andrea Gasman M.D.   On: 02/15/2023 21:08   DG Wrist Complete Right Result Date: 02/15/2023 CLINICAL DATA:  Status post closed reduction of displaced right wrist fracture. EXAM: RIGHT WRIST - COMPLETE 3+ VIEW COMPARISON:  Earlier right wrist series today at 6:32 p.m. FINDINGS: AP and lateral views of 7:56 p.m. Comminuted distal radial metaphyseal fracture, with intact distal articular surface, demonstrates a mild improvement in alignment. There is still a few mm of impaction at the fracture site and there is still mild dorsal angulation, and just under 1 cm of dorsolateral translation of the main distal fragment. Oblique intra-articular fracture of the radial aspect of the distal ulnar metaphysis is still slightly dorsally displaced without other significant displacement. No new fracture has become apparent. Soft tissue swelling is similar. IMPRESSION: 1. Mild improvement in alignment of the comminuted distal radial metaphyseal fracture, with still a few mm of impaction, mild dorsal angulation, and just under 1 cm of dorsolateral translation of the main distal fragment. 2. Oblique intra-articular fracture of the radial aspect of the distal ulnar metaphysis is still slightly dorsally displaced, without other significant displacement. Electronically Signed   By: Francis Quam M.D.   On: 02/15/2023 20:06   DG Wrist Complete Right Result Date: 02/15/2023 CLINICAL DATA:  Fall. EXAM: RIGHT WRIST - COMPLETE 3+ VIEW COMPARISON:  None Available. FINDINGS: Acute impacted fracture of the distal radial metaphysis with dorsal displacement and angulation. Acute mildly  displaced fracture of the ulnar styloid process. No dislocation. Normal carpal alignment. Diffuse soft tissue swelling about the wrist. IMPRESSION: 1. Acute impacted fracture of the distal radial metaphysis. 2. Acute mildly displaced fracture of the ulnar styloid process. Electronically Signed   By: Elsie ONEIDA Shoulder M.D.   On: 02/15/2023 18:51    Procedures .Reduction of fracture  Date/Time: 02/15/2023 8:49 PM  Performed by: Veta Palma, PA-C Authorized by: Veta Palma, PA-C  Consent: Verbal consent obtained. Risks and benefits: risks, benefits and alternatives were discussed Consent given by: patient and guardian Patient understanding: patient states understanding of the procedure being performed Test results: test results available and properly labeled Patient identity confirmed: verbally with patient Time out: Immediately prior to procedure a time out was called to verify the correct patient, procedure, equipment, support staff and site/side  marked as required. Local anesthesia used: yes Anesthesia: hematoma block  Anesthesia: Local anesthesia used: yes Local Anesthetic: lidocaine  1% without epinephrine (8 ml)  Sedation: Patient sedated: no  Patient tolerance: patient tolerated the procedure well with no immediate complications       Medications Ordered in ED Medications  oxyCODONE -acetaminophen  (PERCOCET/ROXICET) 5-325 MG per tablet 1 tablet (1 tablet Oral Given 02/15/23 1835)  lidocaine  (PF) (XYLOCAINE ) 1 % injection 10 mL (10 mLs Other Given 02/15/23 1852)    ED Course/ Medical Decision Making/ A&P                                 Medical Decision Making Amount and/or Complexity of Data Reviewed Radiology: ordered.  Risk Prescription drug management.     Differential diagnosis includes but is not limited to fracture, dislocation, soft tissue injury, nerve injury  ED Course:  Patient well-appearing.  Does have obvious deformity of the right wrist  upon arrival.  He does move the right arm without seem to be in any significant pain.  Does not appear to have any notes.  Vascularly intact in the right lower extremity.  X-ray of the right wrist shows comminuted distal radial metaphysis fracture with dorsal displacement.  Also has a mildly displaced fracture of the ulnar styloid process.  Patient was given Percocet for pain.  Hematoma block was performed and reduction attempted with Dr. Pamella and I.  He tolerated this well.  This showed mild improvement of the displaced radial fracture.  Patient was placed in a sugar-tong splint. I consulted Dr. Colman with orthopedics.  He requested a x-ray of the patient's right elbow be obtained prior to discharge.  He will plan on seeing the patient outpatient in his office Monday or Tuesday. Orthopedic tech notified me that it was difficult to obtain the correct x-ray images of the right elbow due to the splint.  Will obtain CT of the right elbow and wrist  Patient stable and appropriate for discharge at this time  Impression: Comminuted fracture of the right distal radial metaphysis, with some displacement. Mildly displaced fracture of the right ulnar styloid process  Disposition:  The patient was discharged home with instructions to call Dr. Lilli office first thing Monday morning to schedule appointment.  Tylenol  and ibuprofen  as needed for pain.  Oxycodone  for breakthrough pain. Return precautions given.   Imaging Studies ordered: I ordered imaging studies including x-ray right wrist, x-ray right elbow, CT right wrist and elbow I independently visualized the imaging with scope of interpretation limited to determining acute life threatening conditions related to emergency care. Imaging showed displaced comminuted fracture of the right distal radial metaphysis, mildly displaced fracture of the right ulnar styloid process. I agree with the radiologist interpretation    Consultations Obtained: I  requested consultation with the Dr. Arlinda with hand,  and discussed lab and imaging findings as well as pertinent plan - they recommend: getting elbow x-ray and will try to see patient either Monday or Tuesday as an outpatient.                Final Clinical Impression(s) / ED Diagnoses Final diagnoses:  Closed fracture of distal end of right radius, unspecified fracture morphology, initial encounter  Closed fracture of distal end of right ulna, unspecified fracture morphology, initial encounter    Rx / DC Orders ED Discharge Orders          Ordered  oxyCODONE  (ROXICODONE ) 5 MG immediate release tablet  Every 6 hours PRN        02/15/23 2123              Veta Palma, PA-C 02/15/23 2125    Pamella Ozell LABOR, DO 02/15/23 2328

## 2023-02-18 ENCOUNTER — Other Ambulatory Visit (INDEPENDENT_AMBULATORY_CARE_PROVIDER_SITE_OTHER): Payer: MEDICAID

## 2023-02-18 ENCOUNTER — Ambulatory Visit (INDEPENDENT_AMBULATORY_CARE_PROVIDER_SITE_OTHER): Payer: MEDICAID | Admitting: Orthopedic Surgery

## 2023-02-18 DIAGNOSIS — S62101D Fracture of unspecified carpal bone, right wrist, subsequent encounter for fracture with routine healing: Secondary | ICD-10-CM | POA: Diagnosis not present

## 2023-02-18 NOTE — Progress Notes (Signed)
 Steven Ritter - 28 y.o. male MRN [id number]  Date of birth: [birth year]  Office Visit Note: Visit Date: 02/18/2023 PCP: Sophronia Ozell BROCKS, MD Referred by: Sophronia Ozell BROCKS, MD  Subjective: No chief complaint on file.  HPI: Steven Ritter is a 28 y.o. male who special-needs and nonverbal, presents today with his parents for evaluation of a right wrist injury sustained 3 days prior.  Injury described as a mechanical fall onto the outstretched right wrist with associated pain and deformity at the right wrist.  He was seen in the emergency department setting, underwent clinical and radiographic workup which showed displaced distal radius fracture.  He underwent attempted reduction of the fracture in the Emergency Department setting and was given close orthopedic follow-up.  On today's visit, he presents in a splint which the family describes as him having difficulty keeping on given his baseline cognitive function.  He is unable to give medical history himself, history obtained from the parents.  Per the parents, he does utilize the hands for daily function, however is fully reliant on having help for day-to-day activity.  Pertinent ROS were reviewed with the patient and found to be negative unless otherwise specified above in HPI.   Visit Reason: right wrist Duration of symptoms: 3 days Hand dominance: right Occupation: disabled, nonverbal Diabetic: No Smoking: No Heart/Lung History: none Blood Thinners:  none  Prior Testing/EMG: 02/15/23 Injections (Date): none  Treatments: splint Prior Surgery: none  Assessment & Plan: Visit Diagnoses:  1. Closed fracture of right wrist with routine healing, subsequent encounter     Plan: Extensive discussion was had with the family today regarding his right wrist injury.  We reviewed the results of his radiographic workup which do show a significantly displaced distal radius fracture.  We discussed treatment modalities ranging from close  reduction and casting to potential open reduction and internal fixation.  We discussed his underlying cognitive function, however given that he does utilize the hands at baseline, he would be in the patient's best interest to obtain more appropriate reduction and stabilization of this fracture to maintain function long-term.  We discussed risk and benefits of the treatment options.  Given that he does have issues with compliance with wound care, family is concerned that there would be issues with potential open reduction and internal fixation which I am in agreement with.  For this reason, we will pursue attempted closed reduction and casting under anesthesia, given that the patient has poor compliance with attempted closed reduction under simple hematoma block which was attempted in the emergency department.  Risks and benefits of the procedure were discussed, we discussed that we would not be performing incisions, instead we would pursue closed reduction and stabilization.  We did discuss the possibility for fracture displacement over time postreduction as well as potential loss of function or deformity in this region.  We also discussed issues with compliance of casting, I did stress that I will be very important in order to maintain stabilization of the reduction that we performed.  Parents expressed full understanding risks including but not limited to infection, bleeding, scarring, stiffness, nerve injury, tendon injury, vascular injury, recurrence of symptoms and need for subsequent treatment.    We will perform closed reduction and casting of the right distal radius fracture in the operating room setting this week under anesthesia.      Follow-up: No follow-ups on file.   Meds & Orders: No orders of the defined types were placed in this  encounter.   Orders Placed This Encounter  Procedures   XR Wrist Complete Right     Procedures: No procedures performed      Clinical History: No  specialty comments available.  He reports that he has never smoked. He has never used smokeless tobacco. No results for input(s): HGBA1C, LABURIC in the last 8760 hours.  Objective:   Vital Signs: There were no vitals taken for this visit.  Physical Exam  Gen: No acute distress CV: Regular Rate. Well-perfused. Warm.  Resp: Breathing unlabored on room air; no wheezing. Psych: Nonverbal  Ortho Exam Right upper extremity: - Skin is intact, spontaneous motion of the digits is appreciated, patient is unable to comply with standard neurovascular exam - Hand is warm well-perfused - There is significant tenderness and deformity notable at the distal radius region consistent with the radiographic workup  Imaging: X-rays of the right wrist demonstrate dorsally angulated distal radius fracture with notable shortening and comminution.  Radiocarpal joint remains well located in all planes.  Past Medical/Family/Surgical/Social History: Medications & Allergies reviewed per EMR, new medications updated. Patient Active Problem List   Diagnosis Date Noted   Closed fracture of base of fifth metatarsal bone of left foot 05/10/18 05/13/2018   Past Medical History:  Diagnosis Date   Autism    Seizures (HCC)    No family history on file. Past Surgical History:  Procedure Laterality Date   WISDOM TOOTH EXTRACTION     Social History   Occupational History   Not on file  Tobacco Use   Smoking status: Never   Smokeless tobacco: Never  Substance and Sexual Activity   Alcohol use: No   Drug use: No   Sexual activity: Not on file    Katrina Daddona Afton Alderton, M.D. [facility] 10:09 AM

## 2023-02-21 DIAGNOSIS — S62101A Fracture of unspecified carpal bone, right wrist, initial encounter for closed fracture: Secondary | ICD-10-CM | POA: Diagnosis not present

## 2023-02-25 ENCOUNTER — Telehealth: Payer: Self-pay | Admitting: Surgery

## 2023-02-28 ENCOUNTER — Telehealth: Payer: Self-pay | Admitting: Orthopedic Surgery

## 2023-02-28 ENCOUNTER — Ambulatory Visit: Payer: MEDICAID | Admitting: Orthopedic Surgery

## 2023-02-28 ENCOUNTER — Other Ambulatory Visit (INDEPENDENT_AMBULATORY_CARE_PROVIDER_SITE_OTHER): Payer: MEDICAID

## 2023-02-28 DIAGNOSIS — S62101D Fracture of unspecified carpal bone, right wrist, subsequent encounter for fracture with routine healing: Secondary | ICD-10-CM

## 2023-02-28 NOTE — Telephone Encounter (Signed)
Dad is requesting a call back to discuss a different plan in regards to patient's cast. Patient thought he was getting his cast off today. Once he got home it tried to cut the cast off himself with a pair of scissors. Dad said it was a fight to get him to stop, and he does not want to go thru that again. Dad is asking to keep this cast on at least until the 6th or 7th, and at that time take it off but do not put a non- removable one back one. Please call dad to discuss possibly coming up with another plan for patient.

## 2023-02-28 NOTE — Progress Notes (Signed)
   Steven Ritter - 28 y.o. male MRN [id number]  Date of birth: August 05, 1995  Office Visit Note: Visit Date: 02/28/2023 PCP: Eartha Inch, MD Referred by: Eartha Inch, MD  Subjective:  HPI: Steven Ritter is a 28 y.o. male who presents today for follow up 1 week status post right wrist closed reduction and casting.  Pertinent ROS were reviewed with the patient and found to be negative unless otherwise specified above in HPI.   Assessment & Plan: Visit Diagnoses: No diagnosis found.  Plan: Long-arm cast remains in place.  X-rays obtained today show stable appearance of the distal radius fracture status post closed reduction.  I did once again emphasized the importance of cast maintenance.  Family did express ongoing difficulty with cast maintenance given his nonverbal and developmental delay.  Will plan for cast change next week in the operating room under sedation given the difficulty with cast changes.  Follow-up: No follow-ups on file.   Meds & Orders: No orders of the defined types were placed in this encounter.  No orders of the defined types were placed in this encounter.    Procedures: No procedures performed       Objective:   Vital Signs: There were no vitals taken for this visit.  Ortho Exam Long-arm cast in place, able to perform digital range of motion spontaneously, hand remains warm well-perfused, exam is limited secondary to nonverbal baseline  Imaging: No results found.   Milisa Kimbell Trevor Mace, M.D. [facility] 8:05 AM

## 2023-03-01 ENCOUNTER — Other Ambulatory Visit: Payer: Self-pay | Admitting: Orthopedic Surgery

## 2023-03-01 ENCOUNTER — Telehealth: Payer: Self-pay | Admitting: Orthopedic Surgery

## 2023-03-01 DIAGNOSIS — S62101D Fracture of unspecified carpal bone, right wrist, subsequent encounter for fracture with routine healing: Secondary | ICD-10-CM

## 2023-03-01 NOTE — Progress Notes (Signed)
HAND SURGERY UPDATE:  Spoke with parents in detail about the patient removing his long-arm cast and the fact that his injury is still in a period of time that the fracture can displace without correct immobilization.  Parents have currently placed the patient in the removable wrist brace that was given to them.  They are unable to bring the patient in today for repeat casting which was offered.  I did explain to them the risks of not appropriate immobilization for the patient moving forward and the possibility for fracture displacement.  They expressed full understanding.  We will attempt to bring him in early next week for repeat clinical and radiographic check, at that time we will try to coordinate with occupational therapy for fabrication of a removable orthosis.  The parents have requested that the patient not be placed into a cast again given that he is unable to tolerate this.  [facility], Hand Surgery

## 2023-03-01 NOTE — Progress Notes (Signed)
Chart reviewed as part of pre op review. Due to patient's history of seizure disorder (last seizure possibly 01/30/23), history of uncooperative behavior and 84kg weight he falls outside of [facility] guidelines. April at Dr Agarwala's office notified.

## 2023-03-01 NOTE — Telephone Encounter (Signed)
Dr. Fara Boros spoke with parents. We are coordinating a time to see [place] back and Nate with splin.t

## 2023-03-01 NOTE — Telephone Encounter (Signed)
Dad was returning a call to [place]. [phone]

## 2023-03-04 ENCOUNTER — Telehealth: Payer: Self-pay | Admitting: Orthopedic Surgery

## 2023-03-04 NOTE — Telephone Encounter (Signed)
See previous message

## 2023-03-04 NOTE — Telephone Encounter (Signed)
Pt's father called requesting a call from Vic Blackbird about his son coming for xrays. Please call Mark ASAP at [phone].

## 2023-03-04 NOTE — Telephone Encounter (Signed)
Called and spoke with dad. Confirmed appointment with our office tomorrow for xrays Then informed him about splint for his son following our appointment

## 2023-03-05 ENCOUNTER — Ambulatory Visit (INDEPENDENT_AMBULATORY_CARE_PROVIDER_SITE_OTHER): Payer: MEDICAID

## 2023-03-05 ENCOUNTER — Ambulatory Visit (INDEPENDENT_AMBULATORY_CARE_PROVIDER_SITE_OTHER): Payer: MEDICAID | Admitting: Orthopedic Surgery

## 2023-03-05 ENCOUNTER — Ambulatory Visit: Payer: MEDICAID | Attending: Orthopedic Surgery | Admitting: Occupational Therapy

## 2023-03-05 DIAGNOSIS — M6281 Muscle weakness (generalized): Secondary | ICD-10-CM | POA: Diagnosis present

## 2023-03-05 DIAGNOSIS — S62101D Fracture of unspecified carpal bone, right wrist, subsequent encounter for fracture with routine healing: Secondary | ICD-10-CM | POA: Insufficient documentation

## 2023-03-05 DIAGNOSIS — M25631 Stiffness of right wrist, not elsewhere classified: Secondary | ICD-10-CM | POA: Diagnosis present

## 2023-03-05 DIAGNOSIS — M25532 Pain in left wrist: Secondary | ICD-10-CM | POA: Diagnosis present

## 2023-03-05 DIAGNOSIS — R600 Localized edema: Secondary | ICD-10-CM

## 2023-03-05 NOTE — Patient Instructions (Signed)
WEARING SCHEDULE:  Wear splint at ALL times except for hygiene care   PURPOSE:  To prevent movement and for protection until injury can heal  CARE OF SPLINT:  Keep splint away from heat sources including: stove, radiator or furnace, or a car in sunlight. The splint can melt and will no longer fit you properly  Keep away from pets and children  Clean the splint with rubbing alcohol 1-2 times per day.  * During this time, make sure you also clean your hand/arm as instructed by your therapist. Then dry hand/arm completely before replacing splint. (When cleaning hand/arm, keep it immobilized in same position until splint is replaced)  PRECAUTIONS/POTENTIAL PROBLEMS: *If you notice or experience increased pain, swelling, numbness, or a lingering reddened area from the splint: Contact your therapist immediately by calling [phone]. You must wear the splint for protection, but we will get you scheduled for adjustments as quickly as possible.  (If only straps or hooks need to be replaced and NO adjustments to the splint need to be made, just call the office ahead and let them know you are coming in)  If you have any medical concerns, please call your doctor immediately

## 2023-03-05 NOTE — Progress Notes (Signed)
   CANDY ZIEGLER - 28 y.o. male MRN [id number]  Date of birth: 10/20/1995  Office Visit Note: Visit Date: 03/05/2023 PCP: Eartha Inch, MD Referred by: Eartha Inch, MD  Subjective:  HPI: BUELL PARCEL is a 28 y.o. male with a history of a right distal radius fracture that was attempted to be treated with closed reduction and casting.  He is nonverbal at baseline, was unable to tolerate the cast and removed it on his own last week.  At that point, plan was initially to perform repeat reduction and casting, however the parents have requested that we not reapplied a cast given his inability to tolerate this.  At this juncture, based on the mutual discussion with the parents and given his baseline needs, decision is being made to continue with nonoperative care despite fracture displacement and angulation.  The plan is for him to be seen by occupational therapy later today for fabrication of an orthosis in order to give him as much stability at the wrist level as possible, and a brace that is tolerable that he will not remove.  Pertinent ROS were reviewed with the patient and found to be negative unless otherwise specified above in HPI.   Assessment & Plan: Visit Diagnoses:  1. Closed fracture of right wrist with routine healing, subsequent encounter     Plan: Repeat x-rays were obtained today of the right wrist which do show the prior distal radius fracture with notable fracture angulation, approximately 35 degrees.  There is also evidence of some shortening of the distal radius fracture as well.  I discussed in detail with his father today the options for treatment, including ongoing conservative care with splinting versus open reduction internal fixation order to correct the alignment and fix the fracture in an anatomic position.  The benefits of this procedure would be to promote fracture healing by providing stability and to heal the fracture in the appropriate alignment. The  alternatives of this surgery would be to treat the fracture with immobilization in a splint/brace/cast or to do no intervention. The patient's questions were answered to his satisfaction. After this discussion, parent has requested that we continue with conservative measures.  I did explain in detail that given the fracture malalignment, there could be a loss of range of motion or function of the wrist long-term.  Full understanding was expressed.  He will see occupational therapy later today.   Follow-up: No follow-ups on file.   Meds & Orders: No orders of the defined types were placed in this encounter.   Orders Placed This Encounter  Procedures   XR Wrist Complete Right     Procedures: No procedures performed       Objective:   Vital Signs: There were no vitals taken for this visit.  Ortho Exam Right upper extremity: - Splint removed today, patient able to perform gentle range of motion of the wrist and digits - Moderate tenderness at the fracture site, skin remains intact without significant irritation or violation - Hand is warm well-perfused  Imaging: XR Wrist Complete Right Result Date: 03/05/2023 X-rays of the right wrist, multiple views were performed today X-rays demonstrate previously known distal radius fracture with interval healing.  There is notable shortening of the fracture seen on the AP view, lateral view demonstrates apex volar angulation of approximately 35 degrees.    Evangelina Delancey Trevor Mace, M.D. [facility] 9:50 AM

## 2023-03-05 NOTE — Therapy (Signed)
OUTPATIENT OCCUPATIONAL THERAPY ORTHO EVALUATION  Patient Name: Steven Ritter MRN: [id number] DOB:07/31/1995, 28 y.o., male Today's Date: 03/05/2023  PCP: Jasper Loser, MD REFERRING PROVIDER: Samuella Cota, MD  END OF SESSION:  OT End of Session - 03/05/23 1211     Visit Number 1    Number of Visits 4    Date for OT Re-Evaluation 05/03/23    Authorization Type [employer] - awaiting auth    OT Start Time 1015    OT Stop Time 1105    OT Time Calculation (min) 50 min    Activity Tolerance Patient tolerated treatment well    Behavior During Therapy WFL for tasks assessed/performed             Past Medical History:  Diagnosis Date   Autism    Seizures (HCC)    Past Surgical History:  Procedure Laterality Date   WISDOM TOOTH EXTRACTION     Patient Active Problem List   Diagnosis Date Noted   Closed fracture of base of fifth metatarsal bone of left foot 05/10/18 05/13/2018    ONSET DATE: 03/01/2023 (referral date)   REFERRING DIAG: S62.101D (ICD-10-CM) - Closed fracture of right wrist with routine healing, subsequent encounter  Note:  Distal radius fracture; s/p closed reduction, patient took cast off   *he is autistic, nonverbal*  THERAPY DIAG:  Stiffness of right wrist, not elsewhere classified  Pain in left wrist  Muscle weakness (generalized)  Localized edema  Rationale for Evaluation and Treatment: Rehabilitation  SUBJECTIVE:   SUBJECTIVE STATEMENT: Pt mostly non verbal but can understand (per dad report)  Pt accompanied by:  DAD  PERTINENT HISTORY: Autism (nonverbal), parents opted for patient not to have surgery but he took cast off because he wanted to be able to remove for showering (per phone call with Dr. Fara Boros)  PRECAUTIONS: Other: wear splint except for showering  RED FLAGS: None   WEIGHT BEARING RESTRICTIONS:  NWB RUE  PAIN:  Are you having pain?  Unable to verbalize but no signs of pain  FALLS: Has patient fallen in last 6  months?  unknown  LIVING ENVIRONMENT: Lives with: lives with their family   PLOF: Needs assistance with ADLs  FAMILY GOALS: get wrist better  NEXT MD VISIT: ?   OBJECTIVE:  Note: Objective measures were completed at Evaluation unless otherwise noted.  HAND DOMINANCE: Right predominantly per pt's father report, however has transitioned nicely to using Lt hand  ADLs: Overall ADLs: Needs assist with all ADLS d/t autism  FUNCTIONAL OUTCOME MEASURES: Unable to assess d/t pt being non verbal and ? Education level   UPPER EXTREMITY ROM:   BUE AROM WFL's except Rt wrist    UPPER EXTREMITY MMT:   NOT TESTED   HAND FUNCTION: Did not test d/t precautions  COORDINATION: Not tested - however pt has some use of fingers  SENSATION: Not tested  EDEMA: mild Rt hand  COGNITION: Overall cognitive status: History of cognitive impairments - at baseline d/t autism, nonverbal, can follow simple demo commands inconsistently  OBSERVATIONS: pt arrived w/ father and had pre-fab clam shell style brace on   TREATMENT DATE: 03/05/23  Fabricated and fitted clam shell splint to keep wrist in neutral position and provide extra protection (per discussion with Dr. Denese Killings on 03/04/23). Educated pt/father in splint wear and care and current precautions  Instructed father to encourage pt to perform composite flex and full finger extension while IN splint to prevent finger/hand stiffness and reduce/control edema. Pt/father also encouraged to keep shoulder and elbow moving   PATIENT EDUCATION: Education details: splint wear and care and current precautions Person educated: Patient and Parent Education method: Chief Technology Officer Education comprehension: father verbalized understanding  HOME EXERCISE PROGRAM: N/A - but did instruct pt/father to keep fingers, elbow, and  shoulder moving with splint ON  GOALS: Goals reviewed with patient? Yes  SHORT TERM GOALS: Target date: 03/21/23  Family to be independent with splint wear and care Baseline: family verbalized understanding, however may need adjustments to splint Goal status: IN PROGRESS   LONG TERM GOALS: Target date: 05/03/23  Pt/family independent with ROM HEP for Rt wrist (once cleared by MD)  Baseline: Not yet addressed d/t current precautions Goal status: INITIAL  2.  Pt/family to be independent with simple functional HEP to encourage return of RUE use and added strengthening when appropriate Baseline: Not yet addressed d/t current precautions Goal status: INITIAL    ASSESSMENT:  CLINICAL IMPRESSION: Patient is a 28 y.o. male who was seen today for occupational therapy evaluation for splinting for Rt distal radius fracture. Hx includes Autism. Pt's father accompanies him today for evaluation and splint education. Patient currently presents below baseline level of functioning demonstrating functional deficits and impairments as noted below. Pt would benefit from skilled OT services in the outpatient setting to address splinting needs, monitor edema, and work on ROM, strength and functional use once MD clears this.    PERFORMANCE DEFICITS: in functional skills including ADLs, coordination, sensation, edema, ROM, strength, pain, Fine motor control, decreased knowledge of precautions, and UE functional use,   IMPAIRMENTS: are limiting patient from ADLs, leisure, and social participation.   COMORBIDITIES: has co-morbidities such as AUTISM  that affects occupational performance. Patient will benefit from skilled OT to address above impairments and improve overall function.  MODIFICATION OR ASSISTANCE TO COMPLETE EVALUATION: Min-Moderate modification of tasks or assist with assess necessary to complete an evaluation.  OT OCCUPATIONAL PROFILE AND HISTORY: Problem focused assessment: Including review  of records relating to presenting problem.  CLINICAL DECISION MAKING: Moderate - several treatment options, min-mod task modification necessary  REHAB POTENTIAL: Good  EVALUATION COMPLEXITY: Low      PLAN:  OT FREQUENCY: 1x/week  OT DURATION: 2 weeks, FOLLOWED BY returning in 4-6 weeks for additional 1x/wk for 2 weeks when pt can begin ROM of wrist per MD clearance  PLANNED INTERVENTIONS: 97535 self care/ADL training, 16109 therapeutic exercise, 97530 therapeutic activity, 97140 manual therapy, 97760 Orthotics management and training, 60454 Splinting (initial encounter), [phone] Subsequent splinting/medication, passive range of motion, patient/family education, and DME and/or AE instructions  RECOMMENDED OTHER SERVICES: none at this time  CONSULTED AND AGREED WITH PLAN OF CARE: Patient and family Adult nurse (father)  PLAN FOR NEXT SESSION: splint check and adjustments prn, then place on hold until MD clears for ROM in approx 6 weeks   Sheran Lawless, OT 03/05/2023, 12:13 PM

## 2023-03-08 ENCOUNTER — Ambulatory Visit (HOSPITAL_BASED_OUTPATIENT_CLINIC_OR_DEPARTMENT_OTHER): Admit: 2023-03-08 | Payer: MEDICAID | Admitting: Orthopedic Surgery

## 2023-03-08 ENCOUNTER — Encounter (HOSPITAL_BASED_OUTPATIENT_CLINIC_OR_DEPARTMENT_OTHER): Payer: Self-pay

## 2023-03-08 SURGERY — CAST APPLICATION
Anesthesia: General | Laterality: Right

## 2023-03-13 ENCOUNTER — Encounter: Payer: MEDICAID | Admitting: Occupational Therapy

## 2023-03-21 ENCOUNTER — Encounter: Payer: MEDICAID | Admitting: Orthopedic Surgery

## 2023-04-09 ENCOUNTER — Ambulatory Visit (INDEPENDENT_AMBULATORY_CARE_PROVIDER_SITE_OTHER): Payer: MEDICAID | Admitting: Orthopedic Surgery

## 2023-04-09 ENCOUNTER — Other Ambulatory Visit (INDEPENDENT_AMBULATORY_CARE_PROVIDER_SITE_OTHER): Payer: MEDICAID

## 2023-04-09 DIAGNOSIS — S52551P Other extraarticular fracture of lower end of right radius, subsequent encounter for closed fracture with malunion: Secondary | ICD-10-CM

## 2023-04-09 DIAGNOSIS — S62101D Fracture of unspecified carpal bone, right wrist, subsequent encounter for fracture with routine healing: Secondary | ICD-10-CM

## 2023-04-09 NOTE — Progress Notes (Signed)
 Steven Ritter - 28 y.o. male MRN [id number]  Date of birth: 1995/05/30  Office Visit Note: Visit Date: 04/09/2023 PCP: Eartha Inch, MD Referred by: Eartha Inch, MD  Subjective:  HPI: Steven Ritter is a 28 y.o. male with autism and nonverbal, who presents today with his father for follow up 6 weeks status post left wrist distal radius fracture closed reduction and casting.  As previously mentioned, patient was unable to tolerate casting after closed reduction and was instead transitioned to a removable brace.  Family had elected to continue to initially pursue non invasive care and continue with nonoperative care despite angulation of the distal radius fracture, discussion extensively regarding treatment plan from an operative versus nonoperative standpoint was had, decision was made at that time for the family to continue with nonoperative care given baseline mental status and concern for poor compliance with operative wounds.  Patient has been compliant with the removable brace as instructed, removes only for hygiene currently.  Pain remains well-controlled.  Pertinent ROS were reviewed with the patient and found to be negative unless otherwise specified above in HPI.   Assessment & Plan: Visit Diagnoses:  1. Closed extraarticular fracture of distal radius, right, with malunion, subsequent encounter   2. Closed fracture of right wrist with routine healing, subsequent encounter     Plan: Patient is doing well clinically, pain is controlled.  Persistent angulation does remain at the distal radius site with impending malunion given ongoing deformity.  However, radiocarpal joint is maintained, on clinical examination today he is able to perform some range of motion at the wrist, there is no significant tenderness at the forearm region with deep palpation.  I have recommended to father today at bedside they should continue with the removable brace as instructed for additional 1  month for protection.  He can begin range of motion exercises with occupational therapy as scheduled.  Remain nonweightbearing to the left upper extremity currently.  I did once again explain the impending malunion of the distal radius at this point, there is persistent angulation at the fracture site of approximately 30 degrees with some shortening.  This will likely preclude the ability for normal function of the wrist, however based on her clinical examination today, patient is comfortable without significant pain and is able to perform some range of motion of the wrist without significant complaint.  Being nonverbal and autistic, does limit his evaluation, however this is the significant reason as to why we chose to stick with nonoperative care understanding the ongoing deformity.  Father expressed full understanding today, he will return approximate 1 month.  Follow-up: No follow-ups on file.   Meds & Orders: No orders of the defined types were placed in this encounter.   Orders Placed This Encounter  Procedures   XR Wrist Complete Right     Procedures: No procedures performed       Objective:   Vital Signs: There were no vitals taken for this visit.  Ortho Exam Right forearm: - Notable deformity to the distal forearm, skin is intact, soft, supple - No significant tenderness with deep palpation at the distal radius, no significant instability - Able to perform range of motion at the wrist flexion 25, extension 15 - Digital range of motion is preserved, composite fist without restriction - Hand is warm well-perfused  Imaging: XR Wrist Complete Right Result Date: 04/09/2023 X-rays of the right wrist, multiple views were obtained today X-rays demonstrate distal radius fracture, extra-articular with  notable angulation, apex volar, approximately 30 degrees.  Shortening is seen as well at the distal radius fracture site.  Notable callus formation is seen on multiple views.  Residual  ulnar positivity is present.    Jacobus Colvin Trevor Mace, M.D. [facility], Hand Surgery

## 2023-04-17 ENCOUNTER — Ambulatory Visit: Payer: MEDICAID | Attending: Orthopedic Surgery | Admitting: Occupational Therapy

## 2023-04-17 ENCOUNTER — Encounter: Payer: Self-pay | Admitting: Occupational Therapy

## 2023-04-17 DIAGNOSIS — M6281 Muscle weakness (generalized): Secondary | ICD-10-CM | POA: Diagnosis present

## 2023-04-17 DIAGNOSIS — R600 Localized edema: Secondary | ICD-10-CM

## 2023-04-17 DIAGNOSIS — M25532 Pain in left wrist: Secondary | ICD-10-CM

## 2023-04-17 DIAGNOSIS — M25631 Stiffness of right wrist, not elsewhere classified: Secondary | ICD-10-CM | POA: Diagnosis present

## 2023-04-17 NOTE — Patient Instructions (Signed)
 AROM: Finger Flexion / Extension    Actively bend fingers of right hand. Start with knuckles furthest from palm, and slowly make a fist. Hold _5___ seconds. Relax. Then straighten fingers as far as possible. Repeat _10___ times per set.  Do __5__ sessions per day.   Combination Movement (Active)    Keep elbow firmly at side with wrist straight. Slowly turn palm up and down  Repeat _10___ times. Do __5__ sessions per day.   AROM: Wrist Extension    With right palm down, bend wrist up slowly and then slowly back down (prevent going towards pinky side). Repeat _10___ times per set.  Do __3-5__ sessions per day.  Opposition (Active)    Touch tip of thumb to nail tip of each finger in turn, making an "O" shape. Repeat __5__ times. Do __5__ sessions per day.

## 2023-04-17 NOTE — Therapy (Signed)
 OUTPATIENT OCCUPATIONAL THERAPY ORTHO TREATMENT  Patient Name: Steven Ritter MRN: [id number] DOB:04-24-1995, 28 y.o., male Today's Date: 04/17/2023  PCP: Jasper Loser, MD REFERRING PROVIDER: Samuella Cota, MD  END OF SESSION:  OT End of Session - 04/17/23 7253     Visit Number 2    Number of Visits 4    Date for OT Re-Evaluation 05/03/23    Authorization Type [employer] - awaiting auth    OT Start Time 0935    OT Stop Time 1015    OT Time Calculation (min) 40 min    Activity Tolerance Patient tolerated treatment well    Behavior During Therapy WFL for tasks assessed/performed             Past Medical History:  Diagnosis Date   Autism    Seizures (HCC)    Past Surgical History:  Procedure Laterality Date   WISDOM TOOTH EXTRACTION     Patient Active Problem List   Diagnosis Date Noted   Closed fracture of base of fifth metatarsal bone of left foot 05/10/18 05/13/2018    ONSET DATE: 03/01/2023 (referral date)   REFERRING DIAG: S62.101D (ICD-10-CM) - Closed fracture of right wrist with routine healing, subsequent encounter  Note:  Distal radius fracture; s/p closed reduction, patient took cast off   *he is autistic, nonverbal*  THERAPY DIAG:  Stiffness of right wrist, not elsewhere classified  Pain in left wrist  Muscle weakness (generalized)  Localized edema  Rationale for Evaluation and Treatment: Rehabilitation  SUBJECTIVE:   SUBJECTIVE STATEMENT: Pt mostly non verbal but can understand (per dad report)  Dad reports splint is going well Pt accompanied by:  DAD  PERTINENT HISTORY: Autism (nonverbal), parents opted for patient not to have surgery but he took cast off because he wanted to be able to remove for showering (per phone call with Dr. Fara Boros)  PRECAUTIONS: Other: wear splint except for showering  RED FLAGS: None   WEIGHT BEARING RESTRICTIONS:  NWB RUE  PAIN:  Are you having pain?  Unable to verbalize but no signs of pain  FALLS:  Has patient fallen in last 6 months?  unknown  LIVING ENVIRONMENT: Lives with: lives with their family   PLOF: Needs assistance with ADLs  FAMILY GOALS: get wrist better     OBJECTIVE:  Note: Objective measures were completed at Evaluation unless otherwise noted.  HAND DOMINANCE: Right predominantly per pt's father report, however has transitioned nicely to using Lt hand  ADLs: Overall ADLs: Needs assist with all ADLS d/t autism  FUNCTIONAL OUTCOME MEASURES: Unable to assess d/t pt being non verbal and ? Education level   UPPER EXTREMITY ROM:   BUE AROM WFL's except Rt wrist    UPPER EXTREMITY MMT:   NOT TESTED   HAND FUNCTION: Did not test d/t precautions  COORDINATION: Not tested - however pt has some use of fingers  SENSATION: Not tested  EDEMA: mild Rt hand  COGNITION: Overall cognitive status: History of cognitive impairments - at baseline d/t autism, nonverbal, can follow simple demo commands inconsistently  OBSERVATIONS: pt arrived w/ father and had pre-fab clam shell style brace on   TREATMENT DATE: 04/17/23  Father reports splint is going well - pt compliant with wearing splint, and no signs of discomfort.   Noted moderate edema at volar distal forearm just below wrist, however does not appear to be from splint and was present prior to splint fabrication. Pt/father issued more compressive stockinette (tensogrip) to assist with edema. Pt/father also issued gel padding to wear at night under stockinette and splint across area of swelling to help with edema control. Father shown gentle retrograde massage. Noted some improvement in edema after therapist demo retrograde massage  Pt/father issued ROM HEP for fingers, wrist, and forearm - see pt instructions for details. Pt can perform finger ROM and forearm pronation/supination in splint. Pt  instructed to take splint off for wrist flex and extension. Pt able to perform A/ROM but did require some AA/ROM to prevent ulnar deviation in wrist flexion. (Wrist ext looked ok I'ly)    PATIENT EDUCATION: Education details: HEP, edema management Person educated: Patient and Parent Education method: Explanation, Demonstration, Verbal cues, and Handouts Education comprehension: father verbalized understanding, returned demonstration, and verbal cues required (pt able to return demo after gentle guidance, father verbalizes understanding with all education)  HOME EXERCISE PROGRAM: 04/17/23: ROM HEP   GOALS: Goals reviewed with patient? Yes  SHORT TERM GOALS: Target date: 03/21/23  Family to be independent with splint wear and care Baseline: family verbalized understanding, however may need adjustments to splint Goal status: MET   LONG TERM GOALS: Target date: 05/03/23  Pt/family independent with ROM HEP for Rt wrist (once cleared by MD)  Baseline: Not yet addressed d/t current precautions Goal status: MET  2.  Pt/family to be independent with simple functional HEP to encourage return of RUE use and added strengthening when appropriate Baseline: Not yet addressed d/t current precautions Goal status: INITIAL    ASSESSMENT:  CLINICAL IMPRESSION: Patient returns today cleared to begin ROM per latest MD note. Pt to continue wearing splint for additional month and non wt bearing through RUE. Pt/family has met STG #1 and LTG #1. Hx includes Autism. Pt's father accompanies him today for further progression and education. Patient currently presents below baseline level of functioning demonstrating functional deficits and impairments as noted below. Pt would benefit from skilled OT services in the outpatient setting to address splinting needs, monitor edema, and work on ROM, strength and functional use once MD clears this.    PERFORMANCE DEFICITS: in functional skills including ADLs,  coordination, sensation, edema, ROM, strength, pain, Fine motor control, decreased knowledge of precautions, and UE functional use,   IMPAIRMENTS: are limiting patient from ADLs, leisure, and social participation.   COMORBIDITIES: has co-morbidities such as AUTISM  that affects occupational performance. Patient will benefit from skilled OT to address above impairments and improve overall function.  MODIFICATION OR ASSISTANCE TO COMPLETE EVALUATION: Min-Moderate modification of tasks or assist with assess necessary to complete an evaluation.  OT OCCUPATIONAL PROFILE AND HISTORY: Problem focused assessment: Including review of records relating to presenting problem.  CLINICAL DECISION MAKING: Moderate - several treatment options, min-mod task modification necessary  REHAB POTENTIAL: Good  EVALUATION COMPLEXITY: Low      PLAN:  OT FREQUENCY: 1x/week  OT DURATION: 2 weeks, FOLLOWED BY returning in 4-6 weeks for additional 1x/wk for 2 weeks when pt can begin ROM of wrist per MD clearance  PLANNED INTERVENTIONS: 97535 self care/ADL training, 40981 therapeutic exercise, 97530 therapeutic activity, 97140 manual therapy, 97760 Orthotics management and training, 19147 Splinting (initial encounter), [id number] Subsequent splinting/medication, passive range of motion,  patient/family education, and DME and/or AE instructions  RECOMMENDED OTHER SERVICES: none at this time  CONSULTED AND AGREED WITH PLAN OF CARE: Patient and family Adult nurse (father)  PLAN FOR NEXT SESSION: follow up prn - discussed with pt's father that we will leave episode of care open for 1 month and if no further concerns will d/c. However, pt may return for 1-2 visits prn   Sheran Lawless, OT 04/17/2023, 9:38 AM

## 2023-05-15 ENCOUNTER — Other Ambulatory Visit: Payer: Self-pay

## 2023-05-15 ENCOUNTER — Ambulatory Visit (INDEPENDENT_AMBULATORY_CARE_PROVIDER_SITE_OTHER): Payer: MEDICAID | Admitting: Orthopedic Surgery

## 2023-05-15 DIAGNOSIS — S52551P Other extraarticular fracture of lower end of right radius, subsequent encounter for closed fracture with malunion: Secondary | ICD-10-CM

## 2023-05-15 DIAGNOSIS — S62101D Fracture of unspecified carpal bone, right wrist, subsequent encounter for fracture with routine healing: Secondary | ICD-10-CM

## 2023-05-15 NOTE — Progress Notes (Unsigned)
   Steven Ritter - 28 y.o. male MRN [id number]  Date of birth: 09/05/1995  Office Visit Note: Visit Date: 05/15/2023 PCP: Eartha Inch, MD Referred by: Eartha Inch, MD  Subjective:  HPI: Steven Ritter is a 28 y.o. male who presents today for follow up 12 weeks status post left wrist closed reduction and casting of distal radius fracture.  Pertinent ROS were reviewed with the patient and found to be negative unless otherwise specified above in HPI.   Assessment & Plan: Visit Diagnoses: No diagnosis found.  Plan: ***  Follow-up: No follow-ups on file.   Meds & Orders: No orders of the defined types were placed in this encounter.  No orders of the defined types were placed in this encounter.    Procedures: No procedures performed       Objective:   Vital Signs: There were no vitals taken for this visit.  Ortho Exam ***  Imaging: No results found.   Dorice Stiggers Trevor Mace, M.D. [facility], Hand Surgery

## 2023-07-10 ENCOUNTER — Ambulatory Visit: Payer: MEDICAID | Admitting: Orthopedic Surgery

## 2023-07-16 ENCOUNTER — Other Ambulatory Visit: Payer: Self-pay

## 2023-07-16 ENCOUNTER — Emergency Department (HOSPITAL_COMMUNITY): Payer: MEDICAID

## 2023-07-16 ENCOUNTER — Emergency Department (HOSPITAL_COMMUNITY)
Admission: EM | Admit: 2023-07-16 | Discharge: 2023-07-16 | Disposition: A | Discharge status: Home / Self Care | Payer: MEDICAID | Attending: Emergency Medicine | Admitting: Emergency Medicine

## 2023-07-16 ENCOUNTER — Encounter (HOSPITAL_COMMUNITY): Payer: Self-pay | Admitting: Emergency Medicine

## 2023-07-16 DIAGNOSIS — F840 Autistic disorder: Secondary | ICD-10-CM | POA: Insufficient documentation

## 2023-07-16 DIAGNOSIS — R569 Unspecified convulsions: Secondary | ICD-10-CM | POA: Diagnosis not present

## 2023-07-16 DIAGNOSIS — E049 Nontoxic goiter, unspecified: Secondary | ICD-10-CM | POA: Insufficient documentation

## 2023-07-16 DIAGNOSIS — W228XXA Striking against or struck by other objects, initial encounter: Secondary | ICD-10-CM | POA: Insufficient documentation

## 2023-07-16 DIAGNOSIS — S0081XA Abrasion of other part of head, initial encounter: Secondary | ICD-10-CM | POA: Diagnosis not present

## 2023-07-16 NOTE — ED Triage Notes (Signed)
 BIB [facility] - pt had seizure today lasting approx 4 min with Hx of seizure. Medications taken as prescribed. PT found on the floor by father convulsing. Pt reported approx 2 min seizure yesterday. Autistic at baseline- 1-2 word sentences is norm.   CBG 107  BP 108/68 HR 90 Spo2 >95%

## 2023-07-16 NOTE — ED Provider Notes (Signed)
 [place] EMERGENCY DEPARTMENT AT [facility] Provider Note   CSN: [id number] Arrival date & time: 07/16/23  2013     History  Chief Complaint  Patient presents with   Seizures    Steven Ritter is a 28 y.o. male.  28 year old male brought in by EMS from home after seizure, parents at bedside.  Patient with history of autism, minimally verbal at baseline speaking in 1-2 word phrases, arrives at baseline mental status.  Family states patient has seizures which are generally managed at home, if seizing longer than 5 minutes are treated with Diastat.  Dad was home today when he heard patient hit the ground, seizure lasted for minutes and stopped without intervention.  States patient normally gets up after his seizure and is ambulatory however did not seem to want to get up and walk this time which was different for him, concerned due to abrasions to left side face.  Family also reports seizure lasting approximately 2 minutes yesterday.  No recent illness.  Has not missed any medications, is compliant with meds.  Immunizations up-to-date including tetanus.       Home Medications Prior to Admission medications   Medication Sig Start Date End Date Taking? Authorizing Provider  atorvastatin (LIPITOR) 10 MG tablet Take 10 mg by mouth daily.    [provider]  cetirizine (ZYRTEC) 10 MG tablet Take 10 mg by mouth daily.    [provider]  clonazePAM  (KLONOPIN ) 0.5 MG disintegrating tablet DISSOLVE 1 AND 1/2 TABLETS ON THE TONGUE 3 TIMES A DAY 06/21/17   [provider]  fluticasone (FLONASE) 50 MCG/ACT nasal spray Place 2 sprays into both nostrils daily. Patient not taking: Reported on 03/05/2023    [provider]  gemfibrozil (LOPID) 600 MG tablet Take 600 mg by mouth 2 (two) times daily before a meal.    [provider]  ibuprofen  (ADVIL ,MOTRIN ) 600 MG tablet Take 1 tablet (600 mg total) by mouth every 6 (six) hours as  needed. Patient not taking: Reported on 03/05/2023 05/10/18   Sueellen Emery, MD  lacosamide  (VIMPAT ) 50 MG TABS tablet 1 by mouth twice a day 7 days, then increase to 2 tablets by mouth twice a day 09/03/14   Eino Gravel, MD  midazolam (VERSED) 5 MG/ML injection Place 2 mLs into the nose as needed (for seizures). Draw up prescribed dose (ml) in syringe, remove blue vial access device, then attach syringe to nasal atomizer for intranasal administration. Patient not taking: Reported on 03/05/2023    [provider]  montelukast (SINGULAIR) 5 MG chewable tablet Chew 10 mg by mouth at bedtime.    [provider]  OXcarbazepine  ER (OXTELLAR XR ) 600 MG TB24 Take 1,200 mg by mouth 2 (two) times daily.    [provider]  OXcarbazepine  ER (OXTELLAR XR ) 600 MG TB24 Take by mouth.    [provider]  pyridOXINE (VITAMIN B-6) 100 MG tablet Take 100 mg by mouth 2 (two) times daily. Patient not taking: Reported on 03/05/2023    [provider]  topiramate  (TOPAMAX ) 50 MG tablet Take 1.5 tablets (75 mg total) by mouth 2 (two) times daily. 04/28/22 05/28/22  Ninetta Basket, MD  topiramate  (TOPAMAX ) 50 MG tablet Take 50 mg by mouth 2 (two) times daily.    [provider]  venlafaxine XR (EFFEXOR-XR) 75 MG 24 hr capsule Take 75 mg by mouth daily with breakfast.    [provider]      Allergies  Codeine, Xyzal [cetirizine hcl], Lacosamide , Lamotrigine, and Levetiracetam    Review of Systems   Review of Systems Level 5 caveat  for limited verbal patient  Physical Exam Updated Vital Signs BP 105/68   Pulse 86   Temp 98.5 F (36.9 C) (Oral)   Ht 5\' 6"  (1.676 m)   Wt 81.6 kg   SpO2 100%   BMI 29.05 kg/m  Physical Exam Vitals and nursing note reviewed.  Constitutional:      General: He is not in acute distress.    Appearance: He is well-developed. He is not diaphoretic.  HENT:     Head: Normocephalic.      Nose: Nose normal.      Mouth/Throat:     Mouth: Mucous membranes are moist.  Eyes:     Extraocular Movements: Extraocular movements intact.     Pupils: Pupils are equal, round, and reactive to light.  Cardiovascular:     Rate and Rhythm: Normal rate and regular rhythm.     Heart sounds: Normal heart sounds.  Pulmonary:     Effort: Pulmonary effort is normal.     Breath sounds: Normal breath sounds.  Abdominal:     Palpations: Abdomen is soft.     Tenderness: There is no abdominal tenderness.  Musculoskeletal:        General: No swelling, tenderness, deformity or signs of injury.     Cervical back: Neck supple. No tenderness.     Right lower leg: No edema.     Left lower leg: No edema.  Skin:    General: Skin is warm and dry.     Findings: No erythema or rash.  Neurological:     General: No focal deficit present.     Mental Status: He is alert.  Psychiatric:        Behavior: Behavior normal.     ED Results / Procedures / Treatments   Labs (all labs ordered are listed, but only abnormal results are displayed) Labs Reviewed - No data to display  EKG None  Radiology CT Head Wo Contrast Result Date: 07/16/2023 CLINICAL DATA:  Head trauma history of seizure EXAM: CT HEAD WITHOUT CONTRAST CT MAXILLOFACIAL WITHOUT CONTRAST CT CERVICAL SPINE WITHOUT CONTRAST TECHNIQUE: Multidetector CT imaging of the head, cervical spine, and maxillofacial structures were performed using the standard protocol without intravenous contrast. Multiplanar CT image reconstructions of the cervical spine and maxillofacial structures were also generated. RADIATION DOSE REDUCTION: This exam was performed according to the departmental dose-optimization program which includes automated exposure control, adjustment of the mA and/or kV according to patient size and/or use of iterative reconstruction technique. COMPARISON:  CT brain 09/03/2014 FINDINGS: CT HEAD FINDINGS Brain: No evidence of acute infarction, hemorrhage, hydrocephalus,  extra-axial collection or mass lesion/mass effect. Vascular: No hyperdense vessel or unexpected calcification. Skull: Normal. Negative for fracture or focal lesion. Other: None CT MAXILLOFACIAL FINDINGS Osseous: Mastoid air cells are clear. Mandibular heads are normally position. No mandibular fracture. Pterygoid plates and zygomatic arches are intact. No acute nasal bone fracture Orbits: Negative. No traumatic or inflammatory finding. Sinuses: Clear. Soft tissues: Negative CT CERVICAL SPINE FINDINGS Alignment: Straightening of the cervical spine. No subluxation. Facet alignment is normal Skull base and vertebrae: No acute fracture. No primary bone lesion or focal pathologic process. Soft tissues and spinal canal: No prevertebral fluid or swelling. No visible canal hematoma. Disc levels:  Within normal limits Upper chest: Slightly enlarged heterogeneous right thyroid. Other: None IMPRESSION: 1. Negative non contrasted CT appearance  of the brain. 2. Straightening of the cervical spine. No acute osseous abnormality. 3. No acute facial bone fracture. 4. Slightly enlarged heterogeneous right thyroid. Recommend thyroid ultrasound (ref: J Am Coll Radiol. 2015 Feb;12(2): 143-50).This should be performed on a nonemergent basis Electronically Signed   By: Esmeralda Hedge M.D.   On: 07/16/2023 22:00   CT Cervical Spine Wo Contrast Result Date: 07/16/2023 CLINICAL DATA:  Head trauma history of seizure EXAM: CT HEAD WITHOUT CONTRAST CT MAXILLOFACIAL WITHOUT CONTRAST CT CERVICAL SPINE WITHOUT CONTRAST TECHNIQUE: Multidetector CT imaging of the head, cervical spine, and maxillofacial structures were performed using the standard protocol without intravenous contrast. Multiplanar CT image reconstructions of the cervical spine and maxillofacial structures were also generated. RADIATION DOSE REDUCTION: This exam was performed according to the departmental dose-optimization program which includes automated exposure control,  adjustment of the mA and/or kV according to patient size and/or use of iterative reconstruction technique. COMPARISON:  CT brain 09/03/2014 FINDINGS: CT HEAD FINDINGS Brain: No evidence of acute infarction, hemorrhage, hydrocephalus, extra-axial collection or mass lesion/mass effect. Vascular: No hyperdense vessel or unexpected calcification. Skull: Normal. Negative for fracture or focal lesion. Other: None CT MAXILLOFACIAL FINDINGS Osseous: Mastoid air cells are clear. Mandibular heads are normally position. No mandibular fracture. Pterygoid plates and zygomatic arches are intact. No acute nasal bone fracture Orbits: Negative. No traumatic or inflammatory finding. Sinuses: Clear. Soft tissues: Negative CT CERVICAL SPINE FINDINGS Alignment: Straightening of the cervical spine. No subluxation. Facet alignment is normal Skull base and vertebrae: No acute fracture. No primary bone lesion or focal pathologic process. Soft tissues and spinal canal: No prevertebral fluid or swelling. No visible canal hematoma. Disc levels:  Within normal limits Upper chest: Slightly enlarged heterogeneous right thyroid. Other: None IMPRESSION: 1. Negative non contrasted CT appearance of the brain. 2. Straightening of the cervical spine. No acute osseous abnormality. 3. No acute facial bone fracture. 4. Slightly enlarged heterogeneous right thyroid. Recommend thyroid ultrasound (ref: J Am Coll Radiol. 2015 Feb;12(2): 143-50).This should be performed on a nonemergent basis Electronically Signed   By: Esmeralda Hedge M.D.   On: 07/16/2023 22:00   CT Maxillofacial WO CM Result Date: 07/16/2023 CLINICAL DATA:  Head trauma history of seizure EXAM: CT HEAD WITHOUT CONTRAST CT MAXILLOFACIAL WITHOUT CONTRAST CT CERVICAL SPINE WITHOUT CONTRAST TECHNIQUE: Multidetector CT imaging of the head, cervical spine, and maxillofacial structures were performed using the standard protocol without intravenous contrast. Multiplanar CT image reconstructions of  the cervical spine and maxillofacial structures were also generated. RADIATION DOSE REDUCTION: This exam was performed according to the departmental dose-optimization program which includes automated exposure control, adjustment of the mA and/or kV according to patient size and/or use of iterative reconstruction technique. COMPARISON:  CT brain 09/03/2014 FINDINGS: CT HEAD FINDINGS Brain: No evidence of acute infarction, hemorrhage, hydrocephalus, extra-axial collection or mass lesion/mass effect. Vascular: No hyperdense vessel or unexpected calcification. Skull: Normal. Negative for fracture or focal lesion. Other: None CT MAXILLOFACIAL FINDINGS Osseous: Mastoid air cells are clear. Mandibular heads are normally position. No mandibular fracture. Pterygoid plates and zygomatic arches are intact. No acute nasal bone fracture Orbits: Negative. No traumatic or inflammatory finding. Sinuses: Clear. Soft tissues: Negative CT CERVICAL SPINE FINDINGS Alignment: Straightening of the cervical spine. No subluxation. Facet alignment is normal Skull base and vertebrae: No acute fracture. No primary bone lesion or focal pathologic process. Soft tissues and spinal canal: No prevertebral fluid or swelling. No visible canal hematoma. Disc levels:  Within normal limits Upper chest: Slightly  enlarged heterogeneous right thyroid. Other: None IMPRESSION: 1. Negative non contrasted CT appearance of the brain. 2. Straightening of the cervical spine. No acute osseous abnormality. 3. No acute facial bone fracture. 4. Slightly enlarged heterogeneous right thyroid. Recommend thyroid ultrasound (ref: J Am Coll Radiol. 2015 Feb;12(2): 143-50).This should be performed on a nonemergent basis Electronically Signed   By: Esmeralda Hedge M.D.   On: 07/16/2023 22:00    Procedures Procedures    Medications Ordered in ED Medications - No data to display  ED Course/ Medical Decision Making/ A&P                                 Medical  Decision Making Amount and/or Complexity of Data Reviewed Radiology: ordered.   28 year old male brought in by EMS from home after seizure with parents at bedside. Seizure witnessed by dad, lasted 4 minutes. Parents had Meko transported today because following his seizure he did not stand up and walk like he usually does, has abrasions to his face and are concerned about head injury. On arrival in the ER, is mentating at baseline, able to speak in 1-2 word phrases.  Extremities with normal ROM. No pain with palpation of neck/back, abdomen is soft and non tender.  Pt is complaint with meds, no missed doses. Discussed with parents, plan is for CT head/face/c-spine as patient is not a reliable historian.  Will hold on labs currently but possibly add labs if parents/patient wish for labs. CT head, C-spine, maxillofacial as ordered for myself is negative for acute abnormality, agree with radiology interpretation. Reviewed results with patient and family who verbalized understanding, provided with paper copy to review with PCP for incidental finding of enlarged right side of thyroid.  Parents are eager to get home to give seizure medications per schedule.  Offered to dose now, parents declined.  Plan is to ambulate patient and if patient is able to ambulate at baseline will be ready to go.         Final Clinical Impression(s) / ED Diagnoses Final diagnoses:  Seizure (HCC)  Abrasion of face, initial encounter  Enlarged thyroid    Rx / DC Orders ED Discharge Orders     None         Erna He 07/16/23 2215    Almond Army, MD 07/16/23 2334

## 2023-07-16 NOTE — Discharge Instructions (Addendum)
 Follow-up with your primary care to review CT results today for ultrasound of the thyroid.

## 2023-12-09 ENCOUNTER — Encounter: Payer: Self-pay | Admitting: Radiology

## 2024-01-16 ENCOUNTER — Other Ambulatory Visit: Payer: Self-pay | Admitting: Otolaryngology

## 2024-01-20 ENCOUNTER — Other Ambulatory Visit: Payer: Self-pay | Admitting: Otolaryngology

## 2024-02-10 NOTE — Pre-Procedure Instructions (Signed)
 Surgical Instructions   Your procedure is scheduled on February 17, 2024. Report to [facility] Main Entrance A at 11:30 A.M., then check in with the Admitting office. Any questions or running late day of surgery: call [phone]  Questions prior to your surgery date: call [phone], Monday-Friday, 8am-4pm. If you experience any cold or flu symptoms such as cough, fever, chills, shortness of breath, etc. between now and your scheduled surgery, please notify us  at the above number.     Remember:  Do not eat or drink after midnight the night before your surgery    Take these medicines the morning of surgery with A SIP OF WATER: atorvastatin (LIPITOR)  cetirizine (ZYRTEC)  clonazePAM  (KLONOPIN )  divalproex (DEPAKOTE)  gemfibrozil (LOPID)  topiramate  (TOPAMAX )  venlafaxine [facility] VIMPAT      May take these medicines IF NEEDED: midazolam (VERSED)    One week prior to surgery, STOP taking any Aspirin (unless otherwise instructed by your surgeon) Aleve, Naproxen, Ibuprofen , Motrin , Advil , Goody's, BC's, all herbal medications, fish oil, and non-prescription vitamins.                     Do NOT Smoke (Tobacco/Vaping) for 24 hours prior to your procedure.  If you use a CPAP at night, you may bring your mask/headgear for your overnight stay.   You will be asked to remove any contacts, glasses, piercing's, hearing aid's, dentures/partials prior to surgery. Please bring cases for these items if needed.    Patients discharged the day of surgery will not be allowed to drive home, and someone needs to stay with them for 24 hours.  SURGICAL WAITING ROOM VISITATION Patients may have no more than 2 support people in the waiting area - these visitors may rotate.   Pre-op nurse will coordinate an appropriate time for 1 ADULT support person, who may not rotate, to accompany patient in pre-op.  Children under the age of 13 must have an adult with them who is not the patient and must  remain in the main waiting area with an adult.  If the patient needs to stay at the hospital during part of their recovery, the visitor guidelines for inpatient rooms apply.  Please refer to the [facility] website for the visitor guidelines for any additional information.   If you received a COVID test during your pre-op visit  it is requested that you wear a mask when out in public, stay away from anyone that may not be feeling well and notify your surgeon if you develop symptoms. If you have been in contact with anyone that has tested positive in the last 10 days please notify you surgeon.      Pre-operative CHG Bathing Instructions   You can play a key role in reducing the risk of infection after surgery. Your skin needs to be as free of germs as possible. You can reduce the number of germs on your skin by washing with CHG (chlorhexidine gluconate) soap before surgery. CHG is an antiseptic soap that kills germs and continues to kill germs even after washing.   DO NOT use if you have an allergy to chlorhexidine/CHG or antibacterial soaps. If your skin becomes reddened or irritated, stop using the CHG and notify one of our RNs at [phone].              TAKE A SHOWER THE NIGHT BEFORE SURGERY   Please keep in mind the following:  DO NOT shave, including legs and underarms, 48 hours  prior to surgery.   You may shave your face before/day of surgery.  Place clean sheets on your bed the night before surgery Use a clean washcloth (not used since being washed) for shower. DO NOT sleep with pet's night before surgery.  CHG Shower Instructions:  Wash your face and private area with normal soap. If you choose to wash your hair, wash first with your normal shampoo.  After you use shampoo/soap, rinse your hair and body thoroughly to remove shampoo/soap residue.  Turn the water OFF and apply half the bottle of CHG soap to a CLEAN washcloth.  Apply CHG soap ONLY FROM YOUR NECK DOWN TO YOUR TOES  (washing for 3-5 minutes)  DO NOT use CHG soap on face, private areas, open wounds, or sores.  Pay special attention to the area where your surgery is being performed.  If you are having back surgery, having someone wash your back for you may be helpful. Wait 2 minutes after CHG soap is applied, then you may rinse off the CHG soap.  Pat dry with a clean towel  Put on clean pajamas    Additional instructions for the day of surgery: If you choose, you may shower the morning of surgery with an antibacterial soap.  DO NOT APPLY any lotions, deodorants, cologne, or perfumes.   Do not wear jewelry or makeup Do not wear nail polish, gel polish, artificial nails, or any other type of covering on natural nails (fingers and toes) Do not bring valuables to the hospital. [facility] is not responsible for valuables/personal belongings. Put on clean/comfortable clothes.  Please brush your teeth.  Ask your nurse before applying any prescription medications to the skin.

## 2024-02-11 ENCOUNTER — Encounter (HOSPITAL_COMMUNITY): Payer: Self-pay

## 2024-02-11 ENCOUNTER — Encounter (HOSPITAL_COMMUNITY)
Admission: RE | Admit: 2024-02-11 | Discharge: 2024-02-11 | Disposition: A | Discharge status: Home / Self Care | Payer: MEDICAID | Source: Ambulatory Visit | Attending: Otolaryngology | Admitting: Otolaryngology

## 2024-02-11 ENCOUNTER — Other Ambulatory Visit: Payer: Self-pay

## 2024-02-11 HISTORY — DX: Nontoxic single thyroid nodule: E04.1

## 2024-02-11 NOTE — Progress Notes (Signed)
 PCP - Dr. Lamar Cornet Cardiologist - Denies Neurologist - Dr. Norleen Blumenthal - last office visit 09/18/2023  PPM/ICD - Denies Device Orders - n/a Rep Notified - n/a  Chest x-ray - n/a EKG - Denies Stress Test - Denies ECHO - Denies Cardiac Cath - Denies  Sleep Study - Denies CPAP - n/a  No DM  Last dose of GLP1 agonist- n/a GLP1 instructions: n/a  Blood Thinner Instructions: n/a Aspirin Instructions: n/a  NPO after midnight  COVID TEST- n/a   Anesthesia review: Yes. Hx of autism and seizures (last seizure sometimes early 2025).  Patient denies shortness of breath, fever, cough and chest pain at PAT appointment. Pt father denies any respiratory illness/infection in the last two months.    All instructions explained to the patient's father, Oneil, with a verbal understanding of the material. Patient's Father agrees to go over the instructions while at home for a better understanding. The opportunity to ask questions was provided.

## 2024-02-12 NOTE — Progress Notes (Signed)
 Anesthesia Chart Review:  Case: [id number] Date/Time: 02/17/24 1315   Procedure: THYROIDECTOMY (Right)   Anesthesia type: General   Diagnosis: Thyroid nodule [E04.1]   Pre-op diagnosis: Thyroid nodule   Location: [facility] ROOM 09 / [facility] OR   Surgeons: Skotnicki, Meghan A, DO       DISCUSSION: Patient is a 29 year old male scheduled for the above procedure. 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 Left 3 cm thyroid nodule was biopsied also, and consistent with benign process. Above procedure planned. Bedside flexible laryngoscopy showed normal vocal cord mobility.  History includes never smoker, autism, non-verbal, seizures, thyroid nodules, wisdom teeth extractions.   According to 02/05/2024 Progress Note by Dr. Dorcus, Abdulhamid can dress and feed himself, can sight read some words and do very simple math, requires 24 hour supervision for daily living. Compliant with medications. No recent seizures. Notes plans for partial thyroidectomy.  Seizure disorder is followed by Dr. Dannielle, last visit 09/18/2023. He had been on oxcarbazepine , but this was tapered off due to hyponatremia. He is currently on: Depakote DR 500 mg TID. Topamax  50 mg BID. Vimpat  200 mg BID. Brand name medically necessary. Clonazepam  0.75 mg TID. Nasal Versed for abortive therapy. Venlafaxine 75+37.5 mg daily. Six month follow-up planned.   Anesthesia team to evaluate on the day of surgery. Father will reportedly be with patient on the day of surgery.   VS: BP 116/73   Pulse 65   Temp 36.4 C   Resp 16   Ht 5' 7 (1.702 m)   Wt 86.5 kg   SpO2 98%   BMI 29.85 kg/m   PROVIDERS: Beam, Lamar POUR, MD is PCP Beryl Cough, MD is endocrinologist  Dannielle Rush, MD is neurologist  LABS: For day of surgery as indicated. As of March 2024, H/H 12.3/37.2, PLT 353, Cr 0.87, glucose  101.   IMAGES: CT Head, Maxillofacial, C-spine 07/16/2023: IMPRESSION: 1. Negative non contrasted CT appearance of the brain. 2. Straightening of the cervical spine. No acute osseous abnormality. 3. No acute facial bone fracture. 4. Slightly enlarged heterogeneous right thyroid. Recommend thyroid ultrasound (ref: J Am Coll Radiol. 2015 Feb;12(2): 143-50).This should be performed on a nonemergent basis    EKG:04/30/2022: SR at 91 bpm   CV: N/A  Past Medical History:  Diagnosis Date   Autism    Non-Verbal   Seizures (HCC)    Thyroid nodule     Past Surgical History:  Procedure Laterality Date   WISDOM TOOTH EXTRACTION      MEDICATIONS:  atorvastatin (LIPITOR) 10 MG tablet   cetirizine (ZYRTEC) 10 MG tablet   clonazePAM  (KLONOPIN ) 0.5 MG tablet   divalproex (DEPAKOTE) 500 MG DR tablet   fluticasone (FLONASE) 50 MCG/ACT nasal spray   gemfibrozil (LOPID) 600 MG tablet   ibuprofen  (ADVIL ,MOTRIN ) 600 MG tablet   lacosamide  (VIMPAT ) 50 MG TABS tablet   midazolam (VERSED) 5 MG/ML injection   montelukast (SINGULAIR) 5 MG chewable tablet   topiramate  (TOPAMAX ) 50 MG tablet   venlafaxine (EFFEXOR) 37.5 MG tablet   venlafaxine XR (EFFEXOR-XR) 75 MG 24 hr capsule   VIMPAT  200 MG TABS tablet   No current facility-administered medications for this encounter.    Faustino Ruder, PA-C Surgical Short Stay/Anesthesiology [facility] Phone [phone] [facility] Phone [phone] 02/12/2024 11:34 PM

## 2024-02-12 NOTE — Anesthesia Preprocedure Evaluation (Addendum)
"                                    Anesthesia Evaluation  Patient identified by MRN, date of birth, ID band Patient awake  General Assessment Comment:Per Dr Dorcus 02/02/2024 Pt non verbal can dress and feed himself, can sight read some words and do very simple math, requires 24 hour supervision for daily living.  Reviewed: Allergy & Precautions, NPO status , Patient's Chart, lab work & pertinent test results  History of Anesthesia Complications Negative for: history of anesthetic complications  Airway Mallampati: II  TM Distance: >3 FB Neck ROM: Full    Dental no notable dental hx. (+) Teeth Intact, Dental Advisory Given   Pulmonary    Pulmonary exam normal breath sounds clear to auscultation       Cardiovascular (-) hypertension(-) angina (-) Past MI Normal cardiovascular exam Rhythm:Regular Rate:Normal     Neuro/Psych Seizures -,        Autistic non verbal   GI/Hepatic negative GI ROS,,,  Endo/Other  negative endocrine ROS    Renal/GU      Musculoskeletal   Abdominal   Peds  Hematology can dress and feed himself, can sight read some words and do very simple math, requires 24 hour supervision for daily living.   Anesthesia Other Findings All: Codeine, Lacosemide, Certrazine, Lamotrigine, Levetiracetam  Reproductive/Obstetrics                              Anesthesia Physical Anesthesia Plan  ASA: 2  Anesthesia Plan: General   Post-op Pain Management: Precedex , Ofirmev  IV (intra-op)* and Toradol  IV (intra-op)*   Induction:   PONV Risk Score and Plan: Treatment may vary due to age or medical condition, Midazolam and Ondansetron   Airway Management Planned: Oral ETT  Additional Equipment: None  Intra-op Plan:   Post-operative Plan: Extubation in OR  Informed Consent:      Dental advisory given  Plan Discussed with:   Anesthesia Plan Comments: (PAT note written 02/12/2024 by Allison Zelenak, PA-C.  )          Anesthesia Quick Evaluation  "

## 2024-02-14 ENCOUNTER — Other Ambulatory Visit: Payer: Self-pay | Admitting: Otolaryngology

## 2024-02-17 ENCOUNTER — Encounter (HOSPITAL_COMMUNITY)
Admission: RE | Disposition: A | Discharge status: Home / Self Care | Payer: Self-pay | Source: Home / Self Care | Attending: Otolaryngology

## 2024-02-17 ENCOUNTER — Ambulatory Visit (HOSPITAL_COMMUNITY): Payer: MEDICAID | Admitting: Vascular Surgery

## 2024-02-17 ENCOUNTER — Encounter (HOSPITAL_COMMUNITY): Payer: Self-pay | Admitting: Otolaryngology

## 2024-02-17 ENCOUNTER — Ambulatory Visit (HOSPITAL_COMMUNITY): Payer: MEDICAID | Admitting: Anesthesiology

## 2024-02-17 ENCOUNTER — Ambulatory Visit (HOSPITAL_COMMUNITY)
Admission: RE | Admit: 2024-02-17 | Discharge: 2024-02-17 | Disposition: A | Discharge status: Home / Self Care | Payer: MEDICAID | Attending: Otolaryngology | Admitting: Otolaryngology

## 2024-02-17 DIAGNOSIS — E041 Nontoxic single thyroid nodule: Secondary | ICD-10-CM | POA: Insufficient documentation

## 2024-02-17 DIAGNOSIS — D440 Neoplasm of uncertain behavior of thyroid gland: Secondary | ICD-10-CM | POA: Diagnosis not present

## 2024-02-17 DIAGNOSIS — G40909 Epilepsy, unspecified, not intractable, without status epilepticus: Secondary | ICD-10-CM | POA: Diagnosis not present

## 2024-02-17 DIAGNOSIS — F840 Autistic disorder: Secondary | ICD-10-CM | POA: Insufficient documentation

## 2024-02-17 HISTORY — PX: THYROIDECTOMY: SHX17

## 2024-02-17 MED ORDER — HYDROCODONE-ACETAMINOPHEN 5-325 MG PO TABS: 1.0000 | ORAL_TABLET | Freq: Four times a day (QID) | ORAL | 0 refills | Status: AC | PRN

## 2024-02-17 MED ORDER — CEFAZOLIN SODIUM-DEXTROSE 2-4 GM/100ML-% IV SOLN
2.0000 g | INTRAVENOUS | Status: AC
  Administered 2024-02-17: 2 g via INTRAVENOUS
  Filled 2024-02-17: qty 100

## 2024-02-17 MED ORDER — EPINEPHRINE HCL (NASAL) 0.1 % NA SOLN
NASAL | Status: AC
  Filled 2024-02-17: qty 30

## 2024-02-17 MED ORDER — FENTANYL CITRATE (PF) 100 MCG/2ML IJ SOLN
INTRAMUSCULAR | Status: AC
  Filled 2024-02-17: qty 2

## 2024-02-17 MED ORDER — CHLORHEXIDINE GLUCONATE CLOTH 2 % EX PADS
6.0000 | MEDICATED_PAD | Freq: Once | CUTANEOUS | Status: AC
  Administered 2024-02-17: 6 via TOPICAL

## 2024-02-17 MED ORDER — EPHEDRINE 5 MG/ML INJ
INTRAVENOUS | Status: AC
  Filled 2024-02-17: qty 5

## 2024-02-17 MED ORDER — LIDOCAINE-EPINEPHRINE 1 %-1:100000 IJ SOLN
INTRAMUSCULAR | Status: AC
  Filled 2024-02-17: qty 1

## 2024-02-17 MED ORDER — DEXMEDETOMIDINE HCL IN NACL 80 MCG/20ML IV SOLN
INTRAVENOUS | Status: AC
  Filled 2024-02-17: qty 40

## 2024-02-17 MED ORDER — CHLORHEXIDINE GLUCONATE CLOTH 2 % EX PADS: 6.0000 | MEDICATED_PAD | Freq: Once | CUTANEOUS | Status: DC

## 2024-02-17 MED ORDER — FENTANYL CITRATE (PF) 250 MCG/5ML IJ SOLN
INTRAMUSCULAR | Status: DC | PRN
  Administered 2024-02-17: 25 ug via INTRAVENOUS
  Administered 2024-02-17: 75 ug via INTRAVENOUS

## 2024-02-17 MED ORDER — EPINEPHRINE 0.1 % (1MG/ML) IJ FOR NASAL USE
INTRAMUSCULAR | Status: DC | PRN
  Administered 2024-02-17: 1 [drp] via TOPICAL

## 2024-02-17 MED ORDER — LIDOCAINE 2% (20 MG/ML) 5 ML SYRINGE
INTRAMUSCULAR | Status: AC
  Filled 2024-02-17: qty 5

## 2024-02-17 MED ORDER — PROPOFOL 10 MG/ML IV BOLUS
INTRAVENOUS | Status: DC | PRN
  Administered 2024-02-17: 200 mg via INTRAVENOUS

## 2024-02-17 MED ORDER — KETOROLAC TROMETHAMINE 30 MG/ML IJ SOLN
INTRAMUSCULAR | Status: DC | PRN
  Administered 2024-02-17: 30 mg via INTRAVENOUS

## 2024-02-17 MED ORDER — PROPOFOL 10 MG/ML IV BOLUS
INTRAVENOUS | Status: AC
  Filled 2024-02-17: qty 20

## 2024-02-17 MED ORDER — HEMOSTATIC AGENTS (NO CHARGE) OPTIME
TOPICAL | Status: DC | PRN
  Administered 2024-02-17: 1 via TOPICAL

## 2024-02-17 MED ORDER — ORAL CARE MOUTH RINSE: 15.0000 mL | Freq: Once | OROMUCOSAL | Status: AC

## 2024-02-17 MED ORDER — OXYCODONE HCL 5 MG PO TABS: 5.0000 mg | ORAL_TABLET | Freq: Once | ORAL | Status: DC | PRN

## 2024-02-17 MED ORDER — DEXMEDETOMIDINE HCL IN NACL 80 MCG/20ML IV SOLN
INTRAVENOUS | Status: DC | PRN
  Administered 2024-02-17: 8 ug via INTRAVENOUS
  Administered 2024-02-17: 4 ug via INTRAVENOUS
  Administered 2024-02-17 (×2): 8 ug via INTRAVENOUS
  Administered 2024-02-17 (×2): 4 ug via INTRAVENOUS

## 2024-02-17 MED ORDER — DEXAMETHASONE SOD PHOSPHATE PF 10 MG/ML IJ SOLN
INTRAMUSCULAR | Status: DC | PRN
  Administered 2024-02-17: 10 mg via INTRAVENOUS

## 2024-02-17 MED ORDER — ONDANSETRON HCL 4 MG/2ML IJ SOLN
INTRAMUSCULAR | Status: AC
  Filled 2024-02-17: qty 6

## 2024-02-17 MED ORDER — LIDOCAINE 2% (20 MG/ML) 5 ML SYRINGE
INTRAMUSCULAR | Status: DC | PRN
  Administered 2024-02-17: 100 mg via INTRAVENOUS

## 2024-02-17 MED ORDER — FENTANYL CITRATE (PF) 100 MCG/2ML IJ SOLN: 25.0000 ug | INTRAMUSCULAR | Status: DC | PRN

## 2024-02-17 MED ORDER — ONDANSETRON HCL 4 MG/2ML IJ SOLN: 4.0000 mg | Freq: Once | INTRAMUSCULAR | Status: DC | PRN

## 2024-02-17 MED ORDER — ONDANSETRON HCL 4 MG/2ML IJ SOLN
INTRAMUSCULAR | Status: DC | PRN
  Administered 2024-02-17: 4 mg via INTRAVENOUS

## 2024-02-17 MED ORDER — ACETAMINOPHEN 10 MG/ML IV SOLN
INTRAVENOUS | Status: DC | PRN
  Administered 2024-02-17: 1000 mg via INTRAVENOUS

## 2024-02-17 MED ORDER — KETOROLAC TROMETHAMINE 30 MG/ML IJ SOLN: 30.0000 mg | Freq: Once | INTRAMUSCULAR | Status: DC | PRN

## 2024-02-17 MED ORDER — PHENYLEPHRINE HCL-NACL 20-0.9 MG/250ML-% IV SOLN
INTRAVENOUS | Status: DC | PRN
  Administered 2024-02-17: 10 ug/min via INTRAVENOUS

## 2024-02-17 MED ORDER — LACTATED RINGERS IV SOLN: INTRAVENOUS | Status: DC

## 2024-02-17 MED ORDER — ACETAMINOPHEN 10 MG/ML IV SOLN
INTRAVENOUS | Status: AC
  Filled 2024-02-17: qty 100

## 2024-02-17 MED ORDER — PHENYLEPHRINE 80 MCG/ML (10ML) SYRINGE FOR IV PUSH (FOR BLOOD PRESSURE SUPPORT)
PREFILLED_SYRINGE | INTRAVENOUS | Status: DC | PRN
  Administered 2024-02-17 (×9): 80 ug via INTRAVENOUS

## 2024-02-17 MED ORDER — 0.9 % SODIUM CHLORIDE (POUR BTL) OPTIME
TOPICAL | Status: DC | PRN
  Administered 2024-02-17: 1000 mL

## 2024-02-17 MED ORDER — KETOROLAC TROMETHAMINE 30 MG/ML IJ SOLN
INTRAMUSCULAR | Status: AC
  Filled 2024-02-17: qty 2

## 2024-02-17 MED ORDER — CHLORHEXIDINE GLUCONATE 0.12 % MT SOLN
15.0000 mL | Freq: Once | OROMUCOSAL | Status: AC
  Administered 2024-02-17: 15 mL via OROMUCOSAL
  Filled 2024-02-17: qty 15

## 2024-02-17 MED ORDER — OXYCODONE HCL 5 MG/5ML PO SOLN: 5.0000 mg | Freq: Once | ORAL | Status: DC | PRN

## 2024-02-17 MED ORDER — SUCCINYLCHOLINE CHLORIDE 200 MG/10ML IV SOSY
PREFILLED_SYRINGE | INTRAVENOUS | Status: AC
  Filled 2024-02-17: qty 10

## 2024-02-17 MED ORDER — DEXAMETHASONE SOD PHOSPHATE PF 10 MG/ML IJ SOLN
INTRAMUSCULAR | Status: AC
  Filled 2024-02-17: qty 3

## 2024-02-17 MED ORDER — LIDOCAINE-EPINEPHRINE 1 %-1:100000 IJ SOLN
INTRAMUSCULAR | Status: DC | PRN
  Administered 2024-02-17: 10 mL

## 2024-02-17 MED ORDER — CEFAZOLIN SODIUM-DEXTROSE 2-4 GM/100ML-% IV SOLN: 2.0000 g | INTRAVENOUS | Status: DC

## 2024-02-17 MED ORDER — PHENYLEPHRINE HCL (PRESSORS) 10 MG/ML IV SOLN
INTRAVENOUS | Status: AC
  Filled 2024-02-17: qty 1

## 2024-02-17 MED ORDER — SUCCINYLCHOLINE CHLORIDE 200 MG/10ML IV SOSY
PREFILLED_SYRINGE | INTRAVENOUS | Status: DC | PRN
  Administered 2024-02-17: 100 mg via INTRAVENOUS

## 2024-02-17 NOTE — Anesthesia Postprocedure Evaluation (Signed)
"   Anesthesia Post Note  Patient: Steven Ritter  Procedure(s) Performed: THYROIDECTOMY (Right)     Patient location during evaluation: PACU Anesthesia Type: General Level of consciousness: awake and alert Pain management: pain level controlled Vital Signs Assessment: post-procedure vital signs reviewed and stable Respiratory status: spontaneous breathing, nonlabored ventilation, respiratory function stable and patient connected to nasal cannula oxygen Cardiovascular status: blood pressure returned to baseline and stable Postop Assessment: no apparent nausea or vomiting Anesthetic complications: no   No notable events documented.  Last Vitals:  Vitals:   02/17/24 1745 02/17/24 1755  BP: 109/73 109/71  Pulse: 78 82  Resp: 18 12  Temp:  (!) 36.1 C  SpO2: 92% 92%    Last Pain:  Vitals:   02/17/24 1720  TempSrc:   PainSc: Asleep                 [name]      "

## 2024-02-17 NOTE — H&P (Signed)
 KASHTON MCARTOR is an 29 y.o. male.    Chief Complaint:  Right thyroid nodule  HPI: Patient presents today for planned elective procedure. Parents deny any interval change in history since office visit on 01/16/24:  Neema Fluegge is a 29 y.o. male who presents as a return consult patient, referred by Vandegriend, Zachary Peter, MD, for evaluation and treatment of right thyroid nodule.  Patient has history of bilateral thyroid nodules initially noticed by palpation 1 year ago and currently being followed by endocrinology. His most recent ultrasound showed a 2.4 cm right TR 5 thyroid nodule which underwent fine-needle aspiration and returned as atypia of undetermined significance. This was sent for molecular testing and showed a 75% chance of malignancy. He also had a left 3 cm TR 5 thyroid nodule which underwent FNA and returned as a benign Bethesda 2 lesion.   He is otherwise healthy from a medical standpoint other than that he has rather significant autism and a seizure disorder.  Patient was initially seen by Dr. Maggie on 01/01/2024 for his symptoms, and laryngoscopy was performed at that time which was unremarkable. Patient was advised that right hemithyroidectomy was recommended, however due to scheduling limitations, patient was referred to me for further discussion of surgery. There is no family history of thyroid cancer, patient is a non-smoker with no tobacco use history.    Past Medical History:  Diagnosis Date   Autism    Non-Verbal   Seizures (HCC)    Thyroid nodule     Past Surgical History:  Procedure Laterality Date   WISDOM TOOTH EXTRACTION      No family history on file.  Social History:  reports that he has never smoked. He has never used smokeless tobacco. He reports that he does not drink alcohol and does not use drugs.  Allergies: Allergies[1]  Medications Prior to Admission  Medication Sig Dispense Refill   atorvastatin (LIPITOR) 10 MG tablet Take 10  mg by mouth daily.     cetirizine (ZYRTEC) 10 MG tablet Take 10 mg by mouth daily.     clonazePAM  (KLONOPIN ) 0.5 MG tablet Take 0.75 mg by mouth in the morning, at noon, and at bedtime.     divalproex (DEPAKOTE) 500 MG DR tablet Take 500 mg by mouth 3 (three) times daily.     fluticasone (FLONASE) 50 MCG/ACT nasal spray Place 2 sprays into both nostrils at bedtime.     gemfibrozil (LOPID) 600 MG tablet Take 600 mg by mouth 2 (two) times daily before a meal.     montelukast (SINGULAIR) 5 MG chewable tablet Chew 10 mg by mouth at bedtime.     topiramate  (TOPAMAX ) 50 MG tablet Take 1.5 tablets (75 mg total) by mouth 2 (two) times daily. (Patient taking differently: Take 50 mg by mouth 2 (two) times daily.) 90 tablet 0   venlafaxine (EFFEXOR) 37.5 MG tablet Take 37.5 mg by mouth in the morning.     venlafaxine XR (EFFEXOR-XR) 75 MG 24 hr capsule Take 75 mg by mouth daily with breakfast.     VIMPAT  200 MG TABS tablet Take 200 mg by mouth 2 (two) times daily.     ibuprofen  (ADVIL ,MOTRIN ) 600 MG tablet Take 1 tablet (600 mg total) by mouth every 6 (six) hours as needed. 30 tablet 0   lacosamide  (VIMPAT ) 50 MG TABS tablet 1 by mouth twice a day 7 days, then increase to 2 tablets by mouth twice a day (Patient not taking: Reported on 02/07/2024) 60 tablet  1   midazolam (VERSED) 5 MG/ML injection Place 2 mLs into the nose as needed (for seizures). Draw up prescribed dose (ml) in syringe, remove blue vial access device, then attach syringe to nasal atomizer for intranasal administration.      No results found for this or any previous visit (from the past 48 hours). No results found.  ROS: ROS  Blood pressure 114/69, pulse 82, temperature 97.9 F (36.6 C), temperature source Oral, resp. rate 20, height 5' 7 (1.702 m), weight 86.2 kg, SpO2 97%.  PHYSICAL EXAM: Physical Exam Constitutional:      Appearance: Normal appearance.  Pulmonary:     Effort: Pulmonary effort is normal.  Neurological:      General: No focal deficit present.     Mental Status: He is alert.     Studies Reviewed: FNA results, thyroid US  reviewed   Assessment/Plan Alyxander Kollmann is a 29 y.o. male with history of severe autism, seizure disorder with recent FNA and molecular testing demonstrating atypia of undetermined significance with 75% chance of malignancy noted on testing of the right thyroid nodule. Left 3 cm thyroid nodule was biopsied also, and consistent with benign process.  -To OR for right hemithyroidectomy. Risks, benefits, recovery reviewed with patient and his family. All questions answered. Planning for outpatient surgery with no drain placement due to history of severe autism.     Harshita Bernales A Chelcie Estorga 02/17/2024, 1:31 PM       [1]  Allergies Allergen Reactions   Codeine Other (See Comments)    irritability   Xyzal [Cetirizine Hcl] Other (See Comments)    Tremors   Lacosamide  Other (See Comments)    Hostility and aggressive behavior.    Lamotrigine     tremors   Levetiracetam     aggression

## 2024-02-17 NOTE — Anesthesia Procedure Notes (Signed)
 Procedure Name: Intubation Date/Time: 02/17/2024 2:25 PM  Performed by: Jama Powell NOVAK, CRNAPre-anesthesia Checklist: Patient identified, Emergency Drugs available, Suction available and Patient being monitored Patient Re-evaluated:Patient Re-evaluated prior to induction Oxygen Delivery Method: Circle System Utilized Preoxygenation: Pre-oxygenation with 100% oxygen Induction Type: IV induction Ventilation: Mask ventilation without difficulty Laryngoscope Size: Mac, 4 and Glidescope Grade View: Grade I Tube type: Oral Tube size: 7.5 mm Number of attempts: 1 Airway Equipment and Method: Stylet and Oral airway Placement Confirmation: ETT inserted through vocal cords under direct vision, positive ETCO2 and breath sounds checked- equal and bilateral Tube secured with: Tape Dental Injury: Teeth and Oropharynx as per pre-operative assessment  Comments: MD requesting NIMS ETT for surgical procedure.

## 2024-02-17 NOTE — Transfer of Care (Signed)
 Immediate Anesthesia Transfer of Care Note  Patient: Steven Ritter  Procedure(s) Performed: THYROIDECTOMY (Right)  Patient Location: PACU  Anesthesia Type:General  Level of Consciousness: sedated and responds to stimulation  Airway & Oxygen Therapy: Patient Spontanous Breathing  Post-op Assessment: Report given to RN and Post -op Vital signs reviewed and stable  Post vital signs: Reviewed and stable  Last Vitals:  Vitals Value Taken Time  BP 113/64 02/17/24 17:20  Temp    Pulse 80 02/17/24 17:28  Resp 17 02/17/24 17:28  SpO2 92 % 02/17/24 17:28  Vitals shown include unfiled device data.  Last Pain:  Vitals:   02/17/24 1247  TempSrc:   PainSc: 0-No pain         Complications: No notable events documented.

## 2024-02-17 NOTE — Op Note (Signed)
 OPERATIVE NOTE  Steven Ritter Date/Time of Admission: 02/17/2024 11:40 AM  CSN: [id number];FMW:[id number] Attending Provider: Llewellyn Steven A, DO Room/Bed: MCPO/NONE DOB: 11-28-1995 Age: 29 y.o.   Pre-Op Diagnosis: Thyroid nodule  Post-Op Diagnosis: Thyroid nodule  Procedure: Procedures: RIGHT HEMITHYROIDECTOMY  Anesthesia: General  Surgeon(s): Steven DELENA Llewellyn, DO  Staff: Circulator: Primus Leita HERO, RN Scrub Person: Mercer Lilyan FALCON, CST; Orvil Asberry POUR RN First Assistant: Sherrine Moats, RN  Implants: * No implants in log *  Specimens: ID Type Source Tests Collected by Time Destination  1 : Right Thyroid- Stitch Marks Superior Lobe Tissue PATH ENT excision SURGICAL PATHOLOGY Makylee Sanborn A, DO 02/17/2024 1639     Complications: None  EBL: 25 ML  Condition: stable  Operative Findings:  2 of  the 2 right parathyroid gland were identified and were nerurovascularly intact. The right recurrent laryngeal nerve was intact to visual examination and electrical stimulation at 0.4  mA.   Description of Operation: The patient was identified in the preoperative holding area.  Informed written consent including risks, benefits, alternatives, and possible complications including bleeding and nerve injury were obtained.  Patient was then taken, under the care of anesthesia, to the operating suite.  Patient was placed in the supine position on the operating table and then general anesthesia was administered using a NIMS endotracheal tube per anesthesia's protocol.  Incision site on anterior neck was marked and infiltrated with 10cc Lidocaine  1% with 1:100,000 epinephrine .  A transverse incision, approximately 5cm in length was made within a skin crease with a 15 blade scalpel. The incision was carried down through the platysma. Inferiorly and superiorly based subplatysmal flaps were raised to the level of the clavicle and thyroid cartilage, respectively.  Four 2-0 silk  sutures were used to retract the skin flaps at each corner of the incision. The midline raphe of the strap muscles was then identified and separated with the Bovie electrocautery in anatomic midline. The strap muscles were then dissected from the right  thyroid capsule. The strap muscles were retracted laterally. The superior pole vasculature was then identified and surrounding tissue was dissected free using both sharp and blunt instrumentation.  The superior pole vasculature was then clamped, cut, and ligated using Harmonic Scalpel.  The inferior pole was then identified and surrounding tissue were dissected free using both sharp and blunt instrumentation.  The inferior pole vessels were then clamped, cut, and ligated using Harmonic Scalpel. The thyroid was then retracted medially.  The middle thyroid vein was identified and ligated.  The recurrent laryngeal was then identified in the tracheoesophageal groove.  The nerve integrity monitoring probe was then used to stimulate the recurrent laryngeal nerve to ensure its identification and integrity.  The nerve was carefully dissected superiorly until it entered the larynx.  The right   thyroid was removed from the anterior tracheal wall through Berry's Ligament. The thyroid lobe was then passed off the field to be evaluated by pathology. Spot bipolar electrocautery was used to achieve meticulous hemostasis, and the dissected nerve was once again stimulated and found to be functional.  The wound bed was copiously irrigated and once again inspected for any bleeding.  There was none.  Surgicel was placed in the wound bed. The strap muscles were then reapproximated using a 3-0 Vicryl suture in a running, locking fashion. The incision was then closed in a layered fashion. The platysma was closed with interrupted 3-0 Vicryl sutures.  The subcutaneous tissue was then closed with buried interrupted 4-0  vicryl sutures.  The skin was then closed with 4-0 Monocryl suture and  Dermabond. All counts were correct and the procedure was terminated. The patient was returned to the care of anesthesia and extubated without difficulty.    Gerard DELENA Shope, DO [facility]  02/17/2024

## 2024-02-17 NOTE — Anesthesia Procedure Notes (Addendum)
 Procedure Name: Intubation Date/Time: 02/17/2024 2:14 PM  Performed by: Jama Powell NOVAK, CRNAPre-anesthesia Checklist: Patient identified, Emergency Drugs available, Suction available and Patient being monitored Patient Re-evaluated:Patient Re-evaluated prior to induction Oxygen Delivery Method: Circle System Utilized Preoxygenation: Pre-oxygenation with 100% oxygen Induction Type: IV induction Ventilation: Mask ventilation without difficulty Laryngoscope Size: Mac and 4 Grade View: Grade II Tube type: Oral Tube size: 7.5 mm Number of attempts: 1 Airway Equipment and Method: Stylet Placement Confirmation: ETT inserted through vocal cords under direct vision, positive ETCO2 and breath sounds checked- equal and bilateral Secured at: 21 cm Tube secured with: Tape Dental Injury: Teeth and Oropharynx as per pre-operative assessment

## 2024-02-17 NOTE — Discharge Instructions (Signed)
 [facility] TH[facility]ID  [facility] Post Operative Instructions Office Number [phone]  The Surgery Itself Thyroid  surgery involves general anesthesia. Patients may be quite sedated for several hours after surgery and may remain sleepy for much of the day. Nausea and vomiting is occasionally seen, and usually resolves by the evening of surgery - even without additional medications. Some patients stay one night in the hospital and are discharged the next day. Other patients can go home the evening of surgery. Many patients will have a drain in place after surgery-this is removed the following day before you go home from the hospital or in the office 1-3 days after surgery.   Your Incision Your incision is closed with absorbable sutures and is covered with a small strip of tape or skin glue. You can shower and wash your hair as usual starting 24-48 hours after your drain is removed, as directed by your surgeon. If you did not have a drain in place after surgery, you may shower 24 hours after surgery. You may wash in a bathtub prior to that time if you are careful not to get your neck wet. Do not soak or scrub the incision. You might notice bruising around your incision or upper chest and slight swelling above the scar when you are upright. In addition, the scar may become pink and hard. This hardening will peak at about 3 weeks and may result in some tightness or difficulty swallowing, which will disappear over the next 2 to 3 months. You should apply sunscreen on your incision once directed by your surgeon (usually starting one month after surgery) EVERY day for the first year after surgery. This will prevent a red or pink scar and give you the best cosmetic result for your scar. A daily moisturizer with sunscreen (example Oil of Olay with SPF 15) is fine.   Limitations You can start resuming normal activities as tolerated 7 days after surgery. For some patients, lifting can cause pain and stretching at  the surgery site for up to 2-3 weeks after surgery. You should not drive or drink alcohol while taking pain medications. Most people can return to work/school in 1-2 weeks after surgery, but there may be physical limitations as far as what you may do while at work.   Medications ? Pain medication should be used for pain as prescribed. Pain is expected after surgery. Your neck will be sore and pain will be worse when the neck is stretched and when you swallow. As the surgical site heals, pain will resolve over the course of a week. It is not uncommon for pain to get worse when you first go home because your activity may increase, but from that point on the pain should improve every day. Pain medications can cause nausea, which can be prevented if you take them with food or milk. ? You may be given a stool softener (Colace) because pain medications may make you  constipated. You can also use an over the counter stool softener.  ? You may be given Bacitracin ointment. If you are, it should be applied to the drain exit site three times a day for 2 days after the drain is removed. It should also be applied to the drain site before and after your first shower after surgery. It is normal to have some red or pink drainage from your drain exit site for 1-2 days after it is removed. ? If you were taking thyroid  hormone tablets before your operation, you will continue this  after surgery, but sometimes your surgeon will change the dose. If you were not taking thyroid  hormone prior to your operation, your surgeon may prescribe these tablets following surgery if the entire thyroid  is removed. During your post-operative visits, you may have a blood test to measure your levels of thyroid  hormone and your dose of medication may be adjusted accordingly. ? If you had parathyroid surgery or a total thyroidectomy, you may be instructed to take extra calcium supplements until your blood calcium levels stabilize. These usually  have to be purchased over the counter at a drug store and your surgeon will give you specific instructions. Generic brands are fine. Calcium carbonate with Vitamin D or TUMS are usually recommended. If you take any medications for gastric reflux (heartburn), you may be instructed to take Calcium Citrate with vitamin D instead of the other types of calcium. Your surgeon will instruct you on which type of calcium supplement to purchase and how many tablets to take each day after surgery. ? Take all of your routine medications as prescribed, unless told otherwise by your surgeon. Any medications which thin the blood should be avoided until your surgeon tells you to resume them.  ? IT IS OK TO TAKE OVER THE COUNTER PAIN MEDICATION (IBUPROFEN , NAPROXEN , or ACETAMINOPHEN ) IN ADDITION TO YOUR PRESCRIBED MEDICATIONS. DO NOT TAKE ASPIRIN UNLESS CLEARED WITH YOUR SURGEON.  ? Limit Acetaminophen /Tylenol  to less than 4,000mg /day  ? Limit Ibuprofen /Motrin  to less than 3,200mg /day  Pain The main complaint following thyroid  surgery is pain with swallowing and neck movement. Some people experience a dull ache, while others feel a sharp pain. This should not keep you from eating anything you want or moving your neck and will improve daily after surgery. It is normal to have a sore throat as well.   Voice Your voice may go through some temporary changes with fluctuations in volume and clarity (hoarseness). Generally, it will be better in the mornings and tire toward the end of the day. This can last for variable periods of time, but should clear in 8-10 weeks at most.   Cough You may feel like you have phlegm in your throat or a sore throat. This is usually because there was a tube in your windpipe while you were asleep that caused irritation that you perceive as phlegm. You will notice that if you cough, very little phlegm will come up. This should clear up in 4 to 5 days.  Hypocalcemia (Low blood calcium) In  some patients who have thyroid  and parathyroid surgery, the parathyroid glands do not function properly immediately after surgery. This is usually temporary and causes the blood calcium level to drop below normal (hypocalcemia). Symptoms of hypocalcemia include numbness and tingling of your lips (like they fell asleep), in your hands and in your feet. Some patients experience a crawling sensation in the skin, muscle cramps or headaches. These symptoms can appear between 24 and 72 hours after surgery. It is rare for them to start more than 72 hours after surgery. If this happens, take 2 extra calcium tablets. If the symptoms do not resolve within one hour, you should call your doctor. If this happens during the business day, call the nurse triage line. If this happens in the evening or over the weekend, call the on call physician or go to the ER so your blood calcium levels can be checked. You can take extra calcium supplements (which will help to resolve symptoms) as you are contacting the clinic or coming  to the ER. Taking extra calcium supplements if you do not need them will not cause you any harm.  Reasons to call your surgeons office ? Persistent fever over 101 F ? Increasing neck swelling ? Numbness or tingling around your mouth or fingertips  ? Pain that is not relieved by your medications ? Purulent drainage (pus) from the incision ? Redness surrounding the incision that is worsening or getting bigger ? Bleeding is possible after surgery and the most serious cases may cause trouble breathing. Symptoms include rapid swelling in the neck, trouble breathing, red and purple discoloration of the skin over the incision. If breathing difficulty occurs with this rapid neck swelling, call the doctor immediately, go to the closest emergency room or call 911.

## 2024-02-18 ENCOUNTER — Encounter (HOSPITAL_COMMUNITY): Payer: Self-pay | Admitting: Otolaryngology

## 2024-02-21 LAB — SURGICAL PATHOLOGY
